# Patient Record
Sex: Female | Born: 1950 | Race: Black or African American | Hispanic: No | Marital: Married | Smoking: Never smoker
Health system: Southern US, Community
[De-identification: ages and names within clinical notes are randomized; demographics above are authoritative.]

## PROBLEM LIST (undated history)

## (undated) DIAGNOSIS — D649 Anemia, unspecified: Secondary | ICD-10-CM

## (undated) DIAGNOSIS — K5790 Diverticulosis of intestine, part unspecified, without perforation or abscess without bleeding: Secondary | ICD-10-CM

## (undated) DIAGNOSIS — I272 Pulmonary hypertension, unspecified: Secondary | ICD-10-CM

## (undated) DIAGNOSIS — Z9842 Cataract extraction status, left eye: Secondary | ICD-10-CM

## (undated) DIAGNOSIS — D563 Thalassemia minor: Secondary | ICD-10-CM

## (undated) DIAGNOSIS — I6789 Other cerebrovascular disease: Secondary | ICD-10-CM

## (undated) DIAGNOSIS — Z972 Presence of dental prosthetic device (complete) (partial): Secondary | ICD-10-CM

## (undated) DIAGNOSIS — K56609 Unspecified intestinal obstruction, unspecified as to partial versus complete obstruction: Secondary | ICD-10-CM

## (undated) DIAGNOSIS — K2950 Unspecified chronic gastritis without bleeding: Secondary | ICD-10-CM

## (undated) DIAGNOSIS — I639 Cerebral infarction, unspecified: Secondary | ICD-10-CM

## (undated) DIAGNOSIS — E66813 Obesity, class 3: Secondary | ICD-10-CM

## (undated) DIAGNOSIS — M170 Bilateral primary osteoarthritis of knee: Secondary | ICD-10-CM

## (undated) DIAGNOSIS — Z9889 Other specified postprocedural states: Secondary | ICD-10-CM

## (undated) DIAGNOSIS — T8859XA Other complications of anesthesia, initial encounter: Secondary | ICD-10-CM

## (undated) DIAGNOSIS — I619 Nontraumatic intracerebral hemorrhage, unspecified: Secondary | ICD-10-CM

## (undated) DIAGNOSIS — I700 Atherosclerosis of aorta: Secondary | ICD-10-CM

## (undated) DIAGNOSIS — E785 Hyperlipidemia, unspecified: Secondary | ICD-10-CM

## (undated) DIAGNOSIS — M51369 Other intervertebral disc degeneration, lumbar region without mention of lumbar back pain or lower extremity pain: Secondary | ICD-10-CM

## (undated) DIAGNOSIS — E119 Type 2 diabetes mellitus without complications: Secondary | ICD-10-CM

## (undated) DIAGNOSIS — J4540 Moderate persistent asthma, uncomplicated: Secondary | ICD-10-CM

## (undated) DIAGNOSIS — Z974 Presence of external hearing-aid: Secondary | ICD-10-CM

## (undated) DIAGNOSIS — K219 Gastro-esophageal reflux disease without esophagitis: Secondary | ICD-10-CM

## (undated) DIAGNOSIS — I10 Essential (primary) hypertension: Secondary | ICD-10-CM

## (undated) DIAGNOSIS — J189 Pneumonia, unspecified organism: Secondary | ICD-10-CM

## (undated) DIAGNOSIS — K8020 Calculus of gallbladder without cholecystitis without obstruction: Secondary | ICD-10-CM

## (undated) DIAGNOSIS — M1990 Unspecified osteoarthritis, unspecified site: Secondary | ICD-10-CM

## (undated) DIAGNOSIS — H9191 Unspecified hearing loss, right ear: Secondary | ICD-10-CM

## (undated) DIAGNOSIS — I2510 Atherosclerotic heart disease of native coronary artery without angina pectoris: Secondary | ICD-10-CM

## (undated) DIAGNOSIS — R112 Nausea with vomiting, unspecified: Secondary | ICD-10-CM

## (undated) HISTORY — PX: JOINT REPLACEMENT: SHX530

## (undated) HISTORY — DX: Morbid (severe) obesity due to excess calories: E66.01

## (undated) HISTORY — DX: Type 2 diabetes mellitus without complications: E11.9

## (undated) HISTORY — PX: OTHER SURGICAL HISTORY: SHX169

## (undated) HISTORY — DX: Calculus of gallbladder without cholecystitis without obstruction: K80.20

## (undated) HISTORY — DX: Anemia, unspecified: D64.9

## (undated) HISTORY — DX: Obesity, class 3: E66.813

## (undated) HISTORY — PX: ABDOMINAL HYSTERECTOMY: SHX81

## (undated) HISTORY — DX: Essential (primary) hypertension: I10

## (undated) HISTORY — PX: PARTIAL HYSTERECTOMY: SHX80

---

## 1977-03-06 HISTORY — PX: CHOLECYSTECTOMY: SHX55

## 1991-03-07 HISTORY — PX: PARTIAL HYSTERECTOMY: SHX80

## 2007-04-16 DIAGNOSIS — N951 Menopausal and female climacteric states: Secondary | ICD-10-CM | POA: Insufficient documentation

## 2007-05-29 ENCOUNTER — Ambulatory Visit: Payer: Self-pay | Admitting: Gastroenterology

## 2007-07-09 ENCOUNTER — Ambulatory Visit: Payer: Self-pay

## 2008-05-14 ENCOUNTER — Emergency Department: Payer: Self-pay | Admitting: Emergency Medicine

## 2008-08-14 ENCOUNTER — Ambulatory Visit: Payer: Self-pay | Admitting: Family Medicine

## 2008-10-20 ENCOUNTER — Ambulatory Visit: Payer: Self-pay

## 2009-11-02 ENCOUNTER — Ambulatory Visit: Payer: Self-pay | Admitting: Family Medicine

## 2012-03-06 DIAGNOSIS — K9189 Other postprocedural complications and disorders of digestive system: Secondary | ICD-10-CM

## 2012-03-06 HISTORY — DX: Other postprocedural complications and disorders of digestive system: K91.89

## 2012-06-04 DIAGNOSIS — G8929 Other chronic pain: Secondary | ICD-10-CM | POA: Insufficient documentation

## 2012-09-02 HISTORY — PX: TOTAL KNEE ARTHROPLASTY: SHX125

## 2012-09-06 LAB — CBC WITH DIFFERENTIAL/PLATELET
Basophil: 2 %
Eosinophil: 5 %
Lymphocytes: 26 %
MCHC: 30.8 g/dL — ABNORMAL LOW (ref 32.0–36.0)
Monocytes: 7 %
Platelet: 263 10*3/uL (ref 150–440)
RDW: 18.6 % — ABNORMAL HIGH (ref 11.5–14.5)

## 2012-09-06 LAB — PROTIME-INR: Prothrombin Time: 15.5 secs — ABNORMAL HIGH (ref 11.5–14.7)

## 2012-09-06 LAB — APTT: Activated PTT: 40.1 secs — ABNORMAL HIGH (ref 23.6–35.9)

## 2012-09-07 ENCOUNTER — Inpatient Hospital Stay: Payer: Self-pay | Admitting: Specialist

## 2012-09-07 LAB — COMPREHENSIVE METABOLIC PANEL
Alkaline Phosphatase: 86 U/L (ref 50–136)
Anion Gap: 7 (ref 7–16)
BUN: 23 mg/dL — ABNORMAL HIGH (ref 7–18)
Calcium, Total: 9 mg/dL (ref 8.5–10.1)
Chloride: 100 mmol/L (ref 98–107)
EGFR (African American): 35 — ABNORMAL LOW
EGFR (Non-African Amer.): 30 — ABNORMAL LOW
Glucose: 136 mg/dL — ABNORMAL HIGH (ref 65–99)
SGPT (ALT): 27 U/L (ref 12–78)
Total Protein: 7.1 g/dL (ref 6.4–8.2)

## 2012-09-07 LAB — CK TOTAL AND CKMB (NOT AT ARMC)
CK, Total: 170 U/L (ref 21–215)
CK, Total: 172 U/L (ref 21–215)
CK, Total: 245 U/L — ABNORMAL HIGH (ref 21–215)
CK-MB: 1.2 ng/mL (ref 0.5–3.6)
CK-MB: 1.2 ng/mL (ref 0.5–3.6)
CK-MB: 1.3 ng/mL (ref 0.5–3.6)

## 2012-09-07 LAB — BASIC METABOLIC PANEL
Anion Gap: 9 (ref 7–16)
Calcium, Total: 8.2 mg/dL — ABNORMAL LOW (ref 8.5–10.1)
Co2: 23 mmol/L (ref 21–32)
Glucose: 110 mg/dL — ABNORMAL HIGH (ref 65–99)

## 2012-09-07 LAB — TROPONIN I: Troponin-I: 0.46 ng/mL — ABNORMAL HIGH

## 2012-09-08 LAB — BASIC METABOLIC PANEL
BUN: 15 mg/dL (ref 7–18)
Calcium, Total: 7.9 mg/dL — ABNORMAL LOW (ref 8.5–10.1)
Chloride: 109 mmol/L — ABNORMAL HIGH (ref 98–107)
EGFR (African American): >60
Potassium: 4.1 mmol/L (ref 3.5–5.1)

## 2012-09-08 LAB — CBC WITH DIFFERENTIAL/PLATELET
Eosinophil #: 0.6 10*3/uL (ref 0.0–0.7)
HGB: 7.1 g/dL — ABNORMAL LOW (ref 12.0–16.0)
Lymphocyte #: 1.7 10*3/uL (ref 1.0–3.6)
Lymphocyte %: 16.5 %
MCH: 18.5 pg — ABNORMAL LOW (ref 26.0–34.0)
MCHC: 32.3 g/dL (ref 32.0–36.0)
Monocyte #: 1.3 x10 3/mm — ABNORMAL HIGH (ref 0.2–0.9)
Monocyte %: 13.2 %
Neutrophil #: 6.5 10*3/uL (ref 1.4–6.5)
Neutrophil %: 64.6 %
Platelet: 236 10*3/uL (ref 150–440)
RBC: 3.85 10*6/uL (ref 3.80–5.20)
RDW: 18.3 % — ABNORMAL HIGH (ref 11.5–14.5)

## 2012-09-08 LAB — IRON AND TIBC
Iron Saturation: 14 %
Unbound Iron-Bind.Cap.: 130 ug/dL

## 2012-09-08 LAB — RETICULOCYTES: Reticulocyte: 1.52 %

## 2012-09-09 LAB — CBC WITH DIFFERENTIAL/PLATELET
Basophil #: 0 10*3/uL (ref 0.0–0.1)
Eosinophil #: 0.4 10*3/uL (ref 0.0–0.7)
Eosinophil %: 2.8 %
MCHC: 32.3 g/dL (ref 32.0–36.0)
MCV: 60 fL — ABNORMAL LOW (ref 80–100)
Monocyte #: 1.4 x10 3/mm — ABNORMAL HIGH (ref 0.2–0.9)
Monocyte %: 10.8 %
Neutrophil %: 74.2 %
Platelet: 314 10*3/uL (ref 150–440)
RBC: 4.89 10*6/uL (ref 3.80–5.20)
RDW: 19.6 % — ABNORMAL HIGH (ref 11.5–14.5)
WBC: 13.4 10*3/uL — ABNORMAL HIGH (ref 3.6–11.0)

## 2012-09-10 LAB — CBC WITH DIFFERENTIAL/PLATELET
Eosinophil #: 0.3 10*3/uL (ref 0.0–0.7)
HCT: 29.7 % — ABNORMAL LOW (ref 35.0–47.0)
HGB: 9.5 g/dL — ABNORMAL LOW (ref 12.0–16.0)
Lymphocyte #: 2 10*3/uL (ref 1.0–3.6)
MCHC: 32.1 g/dL (ref 32.0–36.0)
Neutrophil #: 11.2 10*3/uL — ABNORMAL HIGH (ref 1.4–6.5)
RDW: 19.3 % — ABNORMAL HIGH (ref 11.5–14.5)

## 2012-09-10 LAB — BASIC METABOLIC PANEL
Anion Gap: 8 (ref 7–16)
BUN: 5 mg/dL — ABNORMAL LOW (ref 7–18)
Calcium, Total: 9.2 mg/dL (ref 8.5–10.1)
Chloride: 111 mmol/L — ABNORMAL HIGH (ref 98–107)
Co2: 24 mmol/L (ref 21–32)
Creatinine: 0.76 mg/dL (ref 0.60–1.30)
EGFR (African American): >60
EGFR (Non-African Amer.): >60
Glucose: 79 mg/dL (ref 65–99)
Osmolality: 281 (ref 275–301)

## 2012-09-10 LAB — PHOSPHORUS: Phosphorus: 5.8 mg/dL — ABNORMAL HIGH (ref 2.5–4.9)

## 2012-09-10 LAB — MAGNESIUM: Magnesium: 2.7 mg/dL — ABNORMAL HIGH

## 2012-09-11 LAB — CBC WITH DIFFERENTIAL/PLATELET
Eosinophil %: 4.4 %
HCT: 30 % — ABNORMAL LOW (ref 35.0–47.0)
HGB: 9.7 g/dL — ABNORMAL LOW (ref 12.0–16.0)
MCH: 19.3 pg — ABNORMAL LOW (ref 26.0–34.0)
MCHC: 32.2 g/dL (ref 32.0–36.0)
MCV: 60 fL — ABNORMAL LOW (ref 80–100)
Monocyte %: 4.9 %
Neutrophil #: 14.1 10*3/uL — ABNORMAL HIGH (ref 1.4–6.5)
RBC: 5.01 10*6/uL (ref 3.80–5.20)
WBC: 18 10*3/uL — ABNORMAL HIGH (ref 3.6–11.0)

## 2012-09-11 LAB — COMPREHENSIVE METABOLIC PANEL
Albumin: 2.5 g/dL — ABNORMAL LOW (ref 3.4–5.0)
Alkaline Phosphatase: 110 U/L (ref 50–136)
Calcium, Total: 9.2 mg/dL (ref 8.5–10.1)
Chloride: 102 mmol/L (ref 98–107)
Co2: 30 mmol/L (ref 21–32)
EGFR (Non-African Amer.): >60
Osmolality: 281 (ref 275–301)
SGOT(AST): 39 U/L — ABNORMAL HIGH (ref 15–37)
SGPT (ALT): 48 U/L (ref 12–78)
Sodium: 140 mmol/L (ref 136–145)
Total Protein: 7.5 g/dL (ref 6.4–8.2)

## 2012-09-11 LAB — PROTIME-INR
INR: 1.2
Prothrombin Time: 15.1 secs — ABNORMAL HIGH (ref 11.5–14.7)

## 2012-09-11 LAB — APTT: Activated PTT: 32.5 secs (ref 23.6–35.9)

## 2012-09-12 LAB — CBC WITH DIFFERENTIAL/PLATELET
Basophil %: 0.4 %
Eosinophil #: 0.6 10*3/uL (ref 0.0–0.7)
Eosinophil %: 3.7 %
HCT: 27.7 % — ABNORMAL LOW (ref 35.0–47.0)
HGB: 8.9 g/dL — ABNORMAL LOW (ref 12.0–16.0)
Lymphocyte #: 2.2 10*3/uL (ref 1.0–3.6)
Lymphocyte %: 14.9 %
Monocyte #: 1 x10 3/mm — ABNORMAL HIGH (ref 0.2–0.9)
Monocyte %: 6.7 %
Neutrophil %: 74.3 %
RBC: 4.6 10*6/uL (ref 3.80–5.20)
RDW: 18.2 % — ABNORMAL HIGH (ref 11.5–14.5)

## 2012-09-12 LAB — BASIC METABOLIC PANEL
BUN: 15 mg/dL (ref 7–18)
Co2: 29 mmol/L (ref 21–32)
Creatinine: 0.94 mg/dL (ref 0.60–1.30)
EGFR (African American): >60
EGFR (Non-African Amer.): >60
Osmolality: 283 (ref 275–301)
Sodium: 142 mmol/L (ref 136–145)

## 2012-09-12 LAB — URINALYSIS, COMPLETE
Bilirubin,UR: NEGATIVE
Blood: NEGATIVE
Glucose,UR: NEGATIVE mg/dL (ref 0–75)
Protein: 30
Squamous Epithelial: 72

## 2012-09-13 LAB — BASIC METABOLIC PANEL
Anion Gap: 8 (ref 7–16)
BUN: 10 mg/dL (ref 7–18)
Calcium, Total: 8.3 mg/dL — ABNORMAL LOW (ref 8.5–10.1)
Chloride: 107 mmol/L (ref 98–107)
Co2: 26 mmol/L (ref 21–32)
EGFR (African American): >60
EGFR (Non-African Amer.): >60
Glucose: 86 mg/dL (ref 65–99)
Potassium: 3.6 mmol/L (ref 3.5–5.1)

## 2012-09-13 LAB — CBC WITH DIFFERENTIAL/PLATELET
HCT: 27.3 % — ABNORMAL LOW (ref 35.0–47.0)
Lymphocytes: 16 %
Monocytes: 3 %
Myelocyte: 1 %
Platelet: 448 10*3/uL — ABNORMAL HIGH (ref 150–440)
RBC: 4.49 10*6/uL (ref 3.80–5.20)
RDW: 18.3 % — ABNORMAL HIGH (ref 11.5–14.5)
Segmented Neutrophils: 74 %

## 2012-09-18 DIAGNOSIS — R918 Other nonspecific abnormal finding of lung field: Secondary | ICD-10-CM | POA: Insufficient documentation

## 2012-09-18 DIAGNOSIS — Z96651 Presence of right artificial knee joint: Secondary | ICD-10-CM | POA: Insufficient documentation

## 2012-09-19 ENCOUNTER — Encounter: Payer: Self-pay | Admitting: *Deleted

## 2012-10-07 ENCOUNTER — Encounter: Payer: Self-pay | Admitting: *Deleted

## 2012-10-08 ENCOUNTER — Encounter: Payer: Self-pay | Admitting: Cardiovascular Disease

## 2012-10-08 ENCOUNTER — Ambulatory Visit (INDEPENDENT_AMBULATORY_CARE_PROVIDER_SITE_OTHER): Payer: BC Managed Care – PPO | Admitting: Cardiovascular Disease

## 2012-10-08 VITALS — BP 102/68 | HR 82 | Ht 60.0 in | Wt 209.8 lb

## 2012-10-08 DIAGNOSIS — I2789 Other specified pulmonary heart diseases: Secondary | ICD-10-CM

## 2012-10-08 DIAGNOSIS — I272 Pulmonary hypertension, unspecified: Secondary | ICD-10-CM

## 2012-10-08 DIAGNOSIS — I10 Essential (primary) hypertension: Secondary | ICD-10-CM | POA: Insufficient documentation

## 2012-10-08 DIAGNOSIS — R0600 Dyspnea, unspecified: Secondary | ICD-10-CM | POA: Insufficient documentation

## 2012-10-08 DIAGNOSIS — R0609 Other forms of dyspnea: Secondary | ICD-10-CM

## 2012-10-08 NOTE — Assessment & Plan Note (Signed)
Current symptoms include exertional dyspnea without chest pain. She had a recent elevation in cardiac enzymes in the setting of small bowel obstruction with associated acute renal failure and tachycardia. This was likely due to supply demand ischemia. However, she has multiple risk factors for coronary artery disease and thus I recommend evaluation with a pharmacologic nuclear stress test. She is not able to exercise on a treadmill due to her recent right knee replacement.

## 2012-10-08 NOTE — Assessment & Plan Note (Signed)
She has been feeling dizzy with slightly low blood pressure. Thus, I will hold amlodipine for now and reevaluate upon followup.

## 2012-10-08 NOTE — Assessment & Plan Note (Signed)
Echocardiogram showed significant pulmonary hypertension of unclear etiology. I suspect that this might be due to chronic diastolic heart failure and possible underlying sleep apnea.  I will consider further evaluation for this after ischemic cardiac evaluation.

## 2012-10-08 NOTE — Progress Notes (Signed)
Primary care physician: Dr. Ulanda Edison  HPI  This is a pleasant 62 year old African American female who was referred by Dr. Mayford Knife for cardiac evaluation. She has known history of hypertension and diabetes mellitus. She underwent recent right knee replacement at [facility]. She presented shortly after that to [facility] with nausea, vomiting and abdominal pain. She was found to have small bowel obstruction with acute renal failure. She was hypoxic and tachycardic. Cardiac enzymes were mildly elevated which was felt to be due to supply demand ischemia. She underwent lower extremity venous Doppler and VQ scan. Both of them were negative. She had an echocardiogram done which showed normal LV systolic function with possible septal hypokinesis. There was mild tricuspid regurgitation with moderate to severe pulmonary hypertension with an estimated pulmonary artery systolic pressure of 65 mmHg. She has been feeling better since hospital discharge. She denies any chest discomfort. She still complains of dizziness. Blood pressure has been running slightly low. She denies any orthopnea or PND.  Allergies  Allergen Reactions  . Penicillins      Current Outpatient Prescriptions on File Prior to Visit  Medication Sig Dispense Refill  . acetaminophen (TYLENOL) 500 MG tablet Take 500 mg by mouth every 6 (six) hours as needed for pain.      . cetirizine (ZYRTEC) 10 MG tablet Take 10 mg by mouth daily.      Marland Kitchen docusate sodium (COLACE) 100 MG capsule Take 100 mg by mouth every other day.      . promethazine (PHENERGAN) 12.5 MG tablet Take 12.5 mg by mouth every 6 (six) hours as needed for nausea.       No current facility-administered medications on file prior to visit.     Past Medical History  Diagnosis Date  . HTN (hypertension)   . Obesity, Class III, 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y)   . DM (diabetes mellitus)   . Anemia   . Gallstones      Past Surgical History  Procedure Laterality Date  . Partial  hysterectomy    . Gallstones       Family History  Problem Relation Age of Onset  . Heart attack Father      History   Social History  . Marital Status: Married    Spouse Name: N/A    Number of Children: N/A  . Years of Education: N/A   Occupational History  . Not on file.   Social History Main Topics  . Smoking status: Never Smoker   . Smokeless tobacco: Not on file  . Alcohol Use: No  . Drug Use: No  . Sexually Active: Not on file   Other Topics Concern  . Not on file   Social History Narrative  . No narrative on file     ROS Constitutional: Negative for fever, chills, diaphoresis, activity change, appetite change and fatigue.  HENT: Negative for hearing loss, nosebleeds, congestion, sore throat, facial swelling, drooling, trouble swallowing, neck pain, voice change, sinus pressure and tinnitus.  Eyes: Negative for photophobia, pain, discharge and visual disturbance.  Respiratory: Negative for apnea, cough and wheezing.  Cardiovascular: Negative for chest pain, palpitations.  Gastrointestinal: Negative for nausea, vomiting, abdominal pain, diarrhea, constipation, blood in stool and abdominal distention.  Genitourinary: Negative for dysuria, urgency, frequency, hematuria and decreased urine volume.  Musculoskeletal: Negative for myalgias, back pain, joint swelling, arthralgias and gait problem.  Skin: Negative for color change, pallor, rash and wound.  Neurological: Negative for dizziness, tremors, seizures, syncope, speech difficulty, weakness, light-headedness,  numbness and headaches.  Psychiatric/Behavioral: Negative for suicidal ideas, hallucinations, behavioral problems and agitation. The patient is not nervous/anxious.     PHYSICAL EXAM   BP 102/68  Pulse 82  Ht 5' (1.524 m)  Wt 209 lb 12 oz (95.142 kg)  BMI 40.96 kg/m2 Constitutional: She is oriented to person, place, and time. She appears well-developed and well-nourished. No distress.  HENT: No  nasal discharge.  Head: Normocephalic and atraumatic.  Eyes: Pupils are equal and round. Right eye exhibits no discharge. Left eye exhibits no discharge.  Neck: Normal range of motion. Neck supple. No JVD present. No thyromegaly present.  Cardiovascular: Normal rate, regular rhythm, normal heart sounds. Exam reveals no gallop and no friction rub. No murmur heard.  Pulmonary/Chest: Effort normal and breath sounds normal. No stridor. No respiratory distress. She has no wheezes. She has no rales. She exhibits no tenderness.  Abdominal: Soft. Bowel sounds are normal. She exhibits no distension. There is no tenderness. There is no rebound and no guarding.  Musculoskeletal: Normal range of motion. She exhibits no edema and no tenderness.  Neurological: She is alert and oriented to person, place, and time. Coordination normal.  Skin: Skin is warm and dry. No rash noted. She is not diaphoretic. No erythema. No pallor.  Psychiatric: She has a normal mood and affect. Her behavior is normal. Judgment and thought content normal.     ZOX:WRUEA  Rhythm  - occasional ectopic ventricular beat    -Left axis.   ABNORMAL     ASSESSMENT AND PLAN

## 2012-10-08 NOTE — Patient Instructions (Addendum)
Your physician has requested that you have a lexiscan myoview. For further information please visit [email or web address] Please follow instruction sheet, as given.  Stop taking Amlodipine.   Follow up in 1 month.   [facility] MYOVIEW  Your caregiver has ordered a Stress Test with nuclear imaging. The purpose of this test is to evaluate the blood supply to your heart muscle. This procedure is referred to as a "Non-Invasive Stress Test." This is because other than having an IV started in your vein, nothing is inserted or "invades" your body. Cardiac stress tests are done to find areas of poor blood flow to the heart by determining the extent of coronary artery disease (CAD). Some patients exercise on a treadmill, which naturally increases the blood flow to your heart, while others who are  unable to walk on a treadmill due to physical limitations have a pharmacologic/chemical stress agent called Lexiscan . This medicine will mimic walking on a treadmill by temporarily increasing your coronary blood flow.   Please note: these test may take anywhere between 2-4 hours to complete  PLEASE REPORT TO [facility] ENTR[facility]  THE VOLUNTEERS AT THE FIRST DESK WILL DIRECT YOU WHERE TO GO  Date of Procedure:___________08/22/2014__________________________  Arrival Time for Procedure:______7:30am________________________  Instructions regarding medication:   __X__ : Hold diabetes medication morning of procedure   PLEASE NOTIFY THE OFFICE AT LEAST 24 HOURS IN ADVANCE IF YOU ARE UNABLE TO KEEP YOUR APPOINTMENT.  [phone] AND  PLEASE NOTIFY NUCLEAR MEDICINE AT [facility] AT LEAST 24 HOURS IN ADVANCE IF YOU ARE UNABLE TO KEEP YOUR APPOINTMENT. [phone]  How to prepare for your Myoview test:  1. Do not eat or drink after midnight 2. No caffeine for 24 hours prior to test 3. No smoking 24 hours prior to test. 4. Your medication may be taken with water.  If your doctor stopped a medication because of this  test, do not take that medication. 5. Ladies, please do not wear dresses.  Skirts or pants are appropriate. Please wear a short sleeve shirt. 6. No perfume, cologne or lotion. 7. Wear comfortable walking shoes. No heels!

## 2012-10-18 DIAGNOSIS — N259 Disorder resulting from impaired renal tubular function, unspecified: Secondary | ICD-10-CM | POA: Insufficient documentation

## 2012-10-25 ENCOUNTER — Ambulatory Visit: Payer: Self-pay | Admitting: Cardiovascular Disease

## 2012-10-25 DIAGNOSIS — R0602 Shortness of breath: Secondary | ICD-10-CM

## 2012-10-28 ENCOUNTER — Other Ambulatory Visit: Payer: Self-pay

## 2012-10-28 DIAGNOSIS — R0600 Dyspnea, unspecified: Secondary | ICD-10-CM

## 2012-10-29 ENCOUNTER — Telehealth: Payer: Self-pay

## 2012-10-29 NOTE — Telephone Encounter (Signed)
Message copied by Marilynne Halsted on Tue Oct 29, 2012  4:56 PM ------      Message from: Lorine Bears A      Created: Mon Oct 28, 2012  5:50 PM       Inform patient that  stress test was normal. ------

## 2012-11-01 ENCOUNTER — Telehealth: Payer: Self-pay

## 2012-11-01 NOTE — Telephone Encounter (Signed)
Message copied by Marilynne Halsted on Fri Nov 01, 2012 10:19 AM ------      Message from: Lorine Bears A      Created: Mon Oct 28, 2012  5:50 PM       Inform patient that  stress test was normal. ------

## 2012-11-01 NOTE — Telephone Encounter (Signed)
Pt aware of results 

## 2012-11-01 NOTE — Telephone Encounter (Signed)
Message copied by Marilynne Halsted on Fri Nov 01, 2012 10:18 AM ------      Message from: Lorine Bears A      Created: Mon Oct 28, 2012  5:50 PM       Inform patient that  stress test was normal. ------

## 2012-11-08 ENCOUNTER — Encounter: Payer: Self-pay | Admitting: Cardiovascular Disease

## 2012-11-08 ENCOUNTER — Ambulatory Visit (INDEPENDENT_AMBULATORY_CARE_PROVIDER_SITE_OTHER): Payer: BC Managed Care – PPO | Admitting: Cardiovascular Disease

## 2012-11-08 VITALS — BP 126/76 | HR 74 | Ht 60.0 in | Wt 202.5 lb

## 2012-11-08 DIAGNOSIS — R0600 Dyspnea, unspecified: Secondary | ICD-10-CM

## 2012-11-08 DIAGNOSIS — I2789 Other specified pulmonary heart diseases: Secondary | ICD-10-CM

## 2012-11-08 DIAGNOSIS — I10 Essential (primary) hypertension: Secondary | ICD-10-CM

## 2012-11-08 DIAGNOSIS — R0609 Other forms of dyspnea: Secondary | ICD-10-CM

## 2012-11-08 DIAGNOSIS — I272 Pulmonary hypertension, unspecified: Secondary | ICD-10-CM

## 2012-11-08 NOTE — Progress Notes (Signed)
Primary care physician: Dr. Ulanda Edison  HPI  This is a pleasant 62 year old African American female who is here for a follow up visit. . She has known history of hypertension and diabetes mellitus. She underwent recent right knee replacement at [facility]. She presented shortly after that to [facility] with nausea, vomiting and abdominal pain. She was found to have small bowel obstruction with acute renal failure. She was hypoxic and tachycardic. Cardiac enzymes were mildly elevated which was felt to be due to supply demand ischemia. She underwent lower extremity venous Doppler and VQ scan. Both of them were negative. She had an echocardiogram done which showed normal LV systolic function with possible septal hypokinesis. There was mild tricuspid regurgitation with moderate to severe pulmonary hypertension with an estimated pulmonary artery systolic pressure of 65 mmHg. She has been feeling better since hospital discharge. She denies any chest discomfort.  I referred her for a pharmacologic nuclear stress test which showed no evidence of ischemia with normal wall motion and ejection fraction. She is feeling well and denies any shortness of breath.   Allergies  Allergen Reactions  . Penicillins      Current Outpatient Prescriptions on File Prior to Visit  Medication Sig Dispense Refill  . acetaminophen (TYLENOL) 500 MG tablet Take 500 mg by mouth every 6 (six) hours as needed for pain.      . cetirizine (ZYRTEC) 10 MG tablet Take 10 mg by mouth daily.      . citalopram (CELEXA) 20 MG tablet Take 20 mg by mouth daily.      Marland Kitchen docusate sodium (COLACE) 100 MG capsule Take 100 mg by mouth every other day.      . estradiol (ESTRACE) 0.5 MG tablet Take 0.5 mg by mouth daily.      . ferrous gluconate (FERGON) 325 MG tablet Take 325 mg by mouth daily with breakfast.      . lisinopril-hydrochlorothiazide (PRINZIDE,ZESTORETIC) 20-12.5 MG per tablet Take 1 tablet by mouth 2 (two) times daily.      . metFORMIN  (GLUCOPHAGE) 500 MG tablet Take 500 mg by mouth daily with breakfast.       . promethazine (PHENERGAN) 12.5 MG tablet Take 12.5 mg by mouth every 6 (six) hours as needed for nausea.      . ranitidine (ZANTAC) 150 MG tablet Take 150 mg by mouth 2 (two) times daily.       No current facility-administered medications on file prior to visit.     Past Medical History  Diagnosis Date  . HTN (hypertension)   . Obesity, Class III, 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y)   . DM (diabetes mellitus)   . Anemia   . Gallstones      Past Surgical History  Procedure Laterality Date  . Partial hysterectomy    . Gallstones       Family History  Problem Relation Age of Onset  . Heart attack Father      History   Social History  . Marital Status: Married    Spouse Name: N/A    Number of Children: N/A  . Years of Education: N/A   Occupational History  . Not on file.   Social History Main Topics  . Smoking status: Never Smoker   . Smokeless tobacco: Not on file  . Alcohol Use: No  . Drug Use: No  . Sexual Activity: Not on file   Other Topics Concern  . Not on file   Social History Narrative  . No  narrative on file        PHYSICAL EXAM   BP 126/76  Pulse 74  Ht 5' (1.524 m)  Wt 202 lb 8 oz (91.853 kg)  BMI 39.55 kg/m2 Constitutional: She is oriented to person, place, and time. She appears well-developed and well-nourished. No distress.  HENT: No nasal discharge.  Head: Normocephalic and atraumatic.  Eyes: Pupils are equal and round. Right eye exhibits no discharge. Left eye exhibits no discharge.  Neck: Normal range of motion. Neck supple. No JVD present. No thyromegaly present.  Cardiovascular: Normal rate, regular rhythm, normal heart sounds. Exam reveals no gallop and no friction rub. No murmur heard.  Pulmonary/Chest: Effort normal and breath sounds normal. No stridor. No respiratory distress. She has no wheezes. She has no rales. She exhibits no tenderness.  Abdominal:  Soft. Bowel sounds are normal. She exhibits no distension. There is no tenderness. There is no rebound and no guarding.  Musculoskeletal: Normal range of motion. She exhibits no edema and no tenderness.  Neurological: She is alert and oriented to person, place, and time. Coordination normal.  Skin: Skin is warm and dry. No rash noted. She is not diaphoretic. No erythema. No pallor.  Psychiatric: She has a normal mood and affect. Her behavior is normal. Judgment and thought content normal.     ABNORMAL     ASSESSMENT AND PLAN

## 2012-11-08 NOTE — Patient Instructions (Addendum)
Schedule an echo in 3 months.  Follow up 1 week after echo.

## 2012-11-09 NOTE — Assessment & Plan Note (Signed)
This overall  Resolved. Recent nuclear stress test showed no evidence of ischemia with normal ejection fraction.

## 2012-11-09 NOTE — Assessment & Plan Note (Signed)
Blood pressure is well controlled on current medications. 

## 2012-11-09 NOTE — Assessment & Plan Note (Signed)
Echocardiogram while hospitalized showed moderate to severe pulmonary hypertension. There is no clinical evidence of diastolic heart failure. She has no symptoms suggestive of sleep apnea. Evaluation for pulmonary embolism was also negative while hospitalized.  The etiology of this is not entirely clear and this was noted only when she was under stress from small bowel obstruction. I will plan on repeating her echocardiogram in 3 months. If that continues to show significant pulmonary hypertension, she will need further evaluation. Currently,  she seems to be stable.

## 2013-02-11 ENCOUNTER — Other Ambulatory Visit: Payer: Self-pay

## 2013-02-11 ENCOUNTER — Other Ambulatory Visit (HOSPITAL_BASED_OUTPATIENT_CLINIC_OR_DEPARTMENT_OTHER): Payer: BC Managed Care – PPO

## 2013-02-11 DIAGNOSIS — R0600 Dyspnea, unspecified: Secondary | ICD-10-CM

## 2013-02-11 DIAGNOSIS — R0609 Other forms of dyspnea: Secondary | ICD-10-CM

## 2013-02-17 ENCOUNTER — Telehealth: Payer: Self-pay | Admitting: *Deleted

## 2013-02-17 NOTE — Telephone Encounter (Signed)
Patient said her job would not allow her to be off this week and she needed to change appt to 12/22. Appt changed to 11:15am on 02/24/2013. Patient aware.

## 2013-02-20 ENCOUNTER — Ambulatory Visit: Payer: BC Managed Care – PPO | Admitting: Cardiovascular Disease

## 2013-02-24 ENCOUNTER — Ambulatory Visit (INDEPENDENT_AMBULATORY_CARE_PROVIDER_SITE_OTHER): Payer: BC Managed Care – PPO | Admitting: Cardiovascular Disease

## 2013-02-24 ENCOUNTER — Encounter: Payer: Self-pay | Admitting: Cardiovascular Disease

## 2013-02-24 VITALS — BP 107/74 | HR 63 | Ht 60.0 in | Wt 210.8 lb

## 2013-02-24 DIAGNOSIS — I10 Essential (primary) hypertension: Secondary | ICD-10-CM

## 2013-02-24 DIAGNOSIS — R0609 Other forms of dyspnea: Secondary | ICD-10-CM

## 2013-02-24 DIAGNOSIS — R0600 Dyspnea, unspecified: Secondary | ICD-10-CM

## 2013-02-24 DIAGNOSIS — I272 Pulmonary hypertension, unspecified: Secondary | ICD-10-CM

## 2013-02-24 DIAGNOSIS — I2789 Other specified pulmonary heart diseases: Secondary | ICD-10-CM

## 2013-02-24 NOTE — Progress Notes (Signed)
Primary care physician: Dr. Ulanda Edison  HPI  This is a pleasant 62 year old African American female who is here for a follow up visit. . She has known history of hypertension and diabetes mellitus. She underwent right knee replacement in July at [facility]. She presented shortly after that to [facility] with nausea, vomiting and abdominal pain. She was found to have small bowel obstruction with acute renal failure. She was hypoxic and tachycardic. Cardiac enzymes were mildly elevated which was felt to be due to supply demand ischemia. She underwent lower extremity venous Doppler and VQ scan. Both of them were negative. She had an echocardiogram done which showed normal LV systolic function with possible septal hypokinesis. There was mild tricuspid regurgitation with moderate to severe pulmonary hypertension with an estimated pulmonary artery systolic pressure of 65 mmHg. She underwent a pharmacologic nuclear stress test after discharge which showed no evidence of ischemia with normal wall motion and ejection fraction. A follow up echo was done recently which showed normal EF and normal pulmonary pressure. She has no complaints today.    Allergies  Allergen Reactions  . Penicillins      Current Outpatient Prescriptions on File Prior to Visit  Medication Sig Dispense Refill  . aspirin 325 MG tablet Take 325 mg by mouth daily.      . cetirizine (ZYRTEC) 10 MG tablet Take 10 mg by mouth daily.      . citalopram (CELEXA) 20 MG tablet Take 20 mg by mouth daily.      Marland Kitchen docusate sodium (COLACE) 100 MG capsule Take 100 mg by mouth every other day.      . estradiol (ESTRACE) 0.5 MG tablet Take 0.5 mg by mouth daily.      . ferrous gluconate (FERGON) 325 MG tablet Take 325 mg by mouth daily with breakfast.      . lisinopril-hydrochlorothiazide (PRINZIDE,ZESTORETIC) 20-12.5 MG per tablet Take 1 tablet by mouth 2 (two) times daily.      . metFORMIN (GLUCOPHAGE) 500 MG tablet Take 500 mg by mouth daily with  breakfast.       . ranitidine (ZANTAC) 150 MG tablet Take 150 mg by mouth 2 (two) times daily.       No current facility-administered medications on file prior to visit.     Past Medical History  Diagnosis Date  . HTN (hypertension)   . Obesity, Class III, 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y)   . DM (diabetes mellitus)   . Anemia   . Gallstones      Past Surgical History  Procedure Laterality Date  . Partial hysterectomy    . Gallstones       Family History  Problem Relation Age of Onset  . Heart attack Father      History   Social History  . Marital Status: Married    Spouse Name: N/A    Number of Children: N/A  . Years of Education: N/A   Occupational History  . Not on file.   Social History Main Topics  . Smoking status: Never Smoker   . Smokeless tobacco: Not on file  . Alcohol Use: No  . Drug Use: No  . Sexual Activity: Not on file   Other Topics Concern  . Not on file   Social History Narrative  . No narrative on file        PHYSICAL EXAM   BP 107/74  Pulse 63  Ht 5' (1.524 m)  Wt 210 lb 12 oz (95.596 kg)  BMI 41.16 kg/m2 Constitutional: She is oriented to person, place, and time. She appears well-developed and well-nourished. No distress.  HENT: No nasal discharge.  Head: Normocephalic and atraumatic.  Eyes: Pupils are equal and round. Right eye exhibits no discharge. Left eye exhibits no discharge.  Neck: Normal range of motion. Neck supple. No JVD present. No thyromegaly present.  Cardiovascular: Normal rate, regular rhythm, normal heart sounds. Exam reveals no gallop and no friction rub. No murmur heard.  Pulmonary/Chest: Effort normal and breath sounds normal. No stridor. No respiratory distress. She has no wheezes. She has no rales. She exhibits no tenderness.  Abdominal: Soft. Bowel sounds are normal. She exhibits no distension. There is no tenderness. There is no rebound and no guarding.  Musculoskeletal: Normal range of motion. She  exhibits no edema and no tenderness.  Neurological: She is alert and oriented to person, place, and time. Coordination normal.  Skin: Skin is warm and dry. No rash noted. She is not diaphoretic. No erythema. No pallor.  Psychiatric: She has a normal mood and affect. Her behavior is normal. Judgment and thought content normal.     ASSESSMENT AND PLAN

## 2013-02-24 NOTE — Patient Instructions (Signed)
Your physician recommends that you continue on your current medications as directed. Please refer to the Current Medication list given to you today.  Follow up as needed  

## 2013-03-03 NOTE — Assessment & Plan Note (Signed)
Resolved. Negative ischemic cardiac evaluation. Echocardiogram showed resolution of pulmonary hypertension.  Follow up as needed.

## 2013-03-03 NOTE — Assessment & Plan Note (Signed)
Pulmonary pressure was normal on recent echo.

## 2013-03-03 NOTE — Assessment & Plan Note (Signed)
Controlled on current medications 

## 2013-03-25 ENCOUNTER — Ambulatory Visit: Payer: Self-pay | Admitting: Family Medicine

## 2014-06-26 NOTE — Consult Note (Signed)
Brief Consult Note: Diagnosis: Elevated troponin without chest pain.   Patient was seen by consultant.   Consult note dictated.   Comments: Patient has troponin 0.46, probably from demand ischaemia, but advise continuation of lovenox until VQ scan result is known. Also advise echo to assess wall motion.  Electronic Signatures: Angelica Ran (MD)  (Signed 05-Jul-14 09:40)  Authored: Brief Consult Note   Last Updated: 05-Jul-14 09:40 by Angelica Ran (MD)

## 2014-06-26 NOTE — H&P (Signed)
PATIENT NAME:  Whitney Banks, Whitney Banks MR#:  [id number] DATE OF BIRTH:  January 05, 1951  DATE OF ADMISSION:  09/07/2012  REFERRING PHYSICIAN:  Dr. Elyn Peers.  PRIMARY CARE PHYSICIAN:  Dr. Delight Stare.   CHIEF COMPLAINT:  Diarrhea, nausea and vomiting.   HISTORY OF PRESENT ILLNESS:  This is a 64 year old female who presents from home with above-mentioned complaints, the patient had recent history of right total knee replacement at [facility], was discharged home before five days, the patient reports over the last two days she has been feeling weak and had decreased by mouth intake, had been having nausea, vomiting and diarrhea and mild abdominal pain, in the ED, the patient had CT abdomen which did show evidence of small bowel obstruction where she was seen by surgery, Dr. Burt Knack who felt that this most likely is due to adhesions and currently recommends conservative management with NG tube under intermittent suctioning where she had so far around 500 mL of green fluid, but the patient was found to be mildly hypoxic as well tachycardic with elevated troponin, the patient reports she was not ambulatory over the last five days, so there was suspicion for PE, the patient had venous Doppler which was negative for DVT, the patient received one dose of subQ Lovenox empirically as her creatinine was elevated and she could not get CT scan chest with IV contrast to rule out PE, as well patient had elevated troponin, but she denies any chest pain, and we had no old EKG to compare with current EKG which shows poor progression of R waves in anterior leads, as the patient was found to have hemoglobin of 8.2, her records was obtained from [facility] where her last hemoglobin on July 1st was 9, she denies any coffee-ground emesis, as well denies any bright red blood per rectum or melena, the patient is in acute renal failure with a creatinine of 1.8, last value was 1 from [facility] records, the patient as well was given 324 of aspirin in the ED because  of her elevated troponin, given her diarrhea the patient had C. diff. sent in ED which came back negative, hospitalist service were requested to admit the patient for management of her medical problems.   PAST MEDICAL HISTORY: 1.  Diabetes.  2.  Hypertension.  3.  Anemia.   PAST SURGICAL HISTORY: 1.  Cholecystectomy.  2.  Hysterectomy.  3.  Recent right total knee replacement.   SOCIAL HISTORY:  The patient lives with her husband, works as a Teacher, early years/pre, no smoking, no illicit drug use.  No alcohol use.   FAMILY HISTORY:  Significant for coronary artery disease in her mother.   ALLERGIES:  PENICILLIN.   HOME MEDICATIONS: 1.  Aspirin 81 mg oral daily.  2.  MS Contin 15 mg extended release 1 to 2 times a day.  3.  Naproxen 500 mg 2 times a day.  4.  Oxycodone 5 mg 2 tablets every four hours.  5.  Gabapentin 100 mg oral 3 times a day.  6.  Metformin 500 mg oral 2 times a day.   7.  Zyrtec 10 mg oral daily.  8.  Promethazine as needed.  9.  Ranitidine 150 mg oral daily.  10.  Ferrous sulfate 325 mg oral daily.  11.  Amlodipine 5 mg oral daily.  12.  Hydrochlorothiazide/lisinopril 12.5/20 mg oral daily.  13.  Estradiol 0.5 mg 1 tablet oral daily.   REVIEW OF SYSTEMS:  CONSTITUTIONAL:  The patient denies fever, chills.  Complains of fatigue, weakness.  Denies weight gain, weight loss.  EYES:  Denies blurry vision, double vision, inflammation, glaucoma.  EARS, NOSE, THROAT:  Denies tinnitus, ear pain, hearing loss, epistaxis or discharge.  RESPIRATORY:  Denies cough, wheezing, hemoptysis, COPD, dyspnea.  CARDIOVASCULAR:  Denies chest pain, orthopnea, edema, arrhythmia, palpitations, syncope.  GASTROINTESTINAL:  Complains of nausea, vomiting, diarrhea, mild abdominal pain.  Denies hematemesis, melena, bright red blood per rectum, jaundice.  GENITOURINARY:   Denies dysuria, hematuria, renal colic.  ENDOCRINE:  Denies polyuria, polydipsia, heat or cold intolerance.  HEMATOLOGY:   Has history of anemia.  Denies easy bruising, bleeding diathesis.  INTEGUMENT:  Denies acne, rash or skin lesions.  MUSCULOSKELETAL:  Denies arthritis, cramps, gout.  decreased mobility secondary to her recent surgery.  NEUROLOGIC:  Denies CVA, TIA, seizures, headache, dementia, ataxia.  PSYCHIATRIC:  Denies anxiety, insomnia, bipolar disorder, nervousness, substance or alcohol abuse.   PHYSICAL EXAMINATION:  VITAL SIGNS:  Temperature 99, pulse 117, respiratory rate 20, blood pressure 160/58, saturating 94% on room air.  GENERAL:  Morbidly obese female looks comfortable in bed in no apparent distress.  HEENT:  Head atraumatic, normocephalic.  Pupils equal, reactive to light.  Pink conjunctivae.  Anicteric sclerae.  Dry oral mucosa.  Has NG tube in left nares in intermittent low suction.  NECK:  Supple.  No thyromegaly.  No JVD.  CHEST:  Good air entry bilaterally.  No wheezing, rales, rhonchi.  CARDIOVASCULAR:  S1, S2 heard.  No rubs, murmurs, gallops.  ABDOMEN:  Mild tenderness in the right side, but no rebound, no guarding.  Bowel sounds hyperperistaltic.  EXTREMITIES:  Has right lower extremity knee immobilizer, but no edema.  No clubbing.  No cyanosis bilaterally and has good dorsalis pedis pulse bilaterally.  PSYCHIATRIC:  Appropriate affect.  Awake, alert x 3.  Intact judgment and insight. NEUROLOGIC:  Cranial nerves grossly intact.  Motor 5 out of 5.  No focal deficits.  SKIN:  Normal skin turgor.  Warm and dry.  LYMPHATICS:  No cervical lymphadenopathy and no supraclavicular lymphadenopathy.   PERTINENT LABORATORY DATA:  Glucose 136, BUN 23, creatinine 1.82, sodium 135, potassium 4.2, chloride 100, CO2 28, magnesium 1.4, lipase 33, total protein 7.1, ALT 27, AST 24, alk phos 86, total CK 245.  Troponin 0.46, CK-MB 1.2.  White blood cells 9.3, hemoglobin 8.2, hematocrit 26.7, platelets 263.  INR 1.2.  C. diff. negative.  CT abdomen and pelvis showing small bowel obstruction with anterior  right pelvis transition point, a question adhesions and small hiatal hernia.  Venous Doppler, no DVT in bilateral lower extremities.   ASSESSMENT AND PLAN: 1.  Small bowel obstruction, the patient presents with nausea, vomiting and mild abdominal pain.  This is related to small bowel obstruction most likely due to adhesions, has much relief after NG tube insertion on suction, seen and evaluated by surgery, I will continue to follow, meanwhile she will be kept nothing by mouth and as per surgery recommendation she will be getting serial KUBs to follow on resolution of her small bowel obstruction.  2.  Elevated troponins, this is unlikely myocardial infarction, especially as she does not have any chest pain, unfortunately there is no old EKG to compare, this is most likely related to demand ischemia and renal failure, she has already received aspirin and subQ Lovenox in the Emergency Department, we will admit patient to telemetry unit.  We will continue to cycle cardiac enzymes.  We will consult cardiology, meanwhile we will hold on  resuming her Lovenox treatment dose until seen by cardiology.  3.  Acute renal failure.  This is due to volume depletion from decreased by mouth intake and diarrhea and vomiting, we will continue with IV fluids.  4.  Anemia.  Hemoglobin dropped from 9 to 8.2, this is most likely anemia from blood loss during surgery.  We will continue with her iron once she is more stable, is able to take by mouth intake.  5.  Tachycardia, hypoxia and recent immobilization, bilateral venous Doppler negative for DVT, received subQ Lovenox treatment dose for possible pulmonary embolus until it is ruled out.  The patient is planned to have VQ scan in a.m.  6.  Hypomagnesemia.  We will replace.   7.  Diabetes mellitus, as the patient is nothing by mouth, we will keep her on fingersticks blood glucose, if fingersticks are uncontrolled we will start her on sliding scale.  8.  DVT prophylaxis.  The  patient currently will be on sequential compression devices and TED hose, she already received one treatment dose of subQ Lovenox 1 mg/kg, we will await cardiology consultation and decrease and V/Q scan before deciding on what anticoagulation dose we will continue, either treatment or prophylaxis dose.  9.  Recent right total knee replacement.  We will consult physical therapy.   Total time spent on admission and patient care 60 minutes.    ____________________________ Albertine Patricia, MD dse:ea D: 09/07/2012 03:53:32 ET T: 09/07/2012 04:38:35 ET JOB#: [id number]  cc: Albertine Patricia, MD, <Dictator> Jhace Fennell Graciela Husbands MD ELECTRONICALLY SIGNED 09/09/2012 6:44

## 2014-06-26 NOTE — Consult Note (Signed)
PATIENT NAME:  Whitney Banks, Whitney Banks MR#:  [id number] DATE OF BIRTH:  1950/09/07  DATE OF CONSULTATION:  09/07/2012  CONSULTING PHYSICIAN:  Dionisio David, MD  HISTORY OF PRESENT ILLNESS: This is a 64 year old African-American female with a past medical history of knee surgery recently at [facility], who came into the hospital with nausea, vomiting and diarrhea. I was asked to evaluate the patient because the patient's EKG was abnormal and had 0.49 troponin level, which was elevated. The patient denies any chest pain, shortness of breath, PND, orthopnea. She does have apparently abdominal pain. Dr. Burt Knack saw her, and he thinks she had intestinal obstruction due to adhesions, and he advised conservative treatment. Right now, she has an NG tube in place, and she is not having surgery. Her other concern was that her creatinine recently has gone up, so she could not have workup for pulmonary embolism.   PAST MEDICAL HISTORY: History of diabetes, hypertension, anemia, hysterectomy, status post right knee replacement.   SOCIAL HISTORY: She denies EtOH abuse or smoking.   FAMILY HISTORY: Her mother had coronary artery disease.   ALLERGIES: PENICILLIN.  MEDICATIONS: Right now, she is just taking aspirin and was just started on Lovenox. Her home medications included amlodipine 5 mg, hydrochlorothiazide/lisinopril 12.5/20 once a day, and she is also on metformin 500 mg 2 times a day.   PHYSICAL EXAMINATION:  GENERAL: She appears to be quite comfortable, alert and oriented, in no acute distress.  VITAL SIGNS: Her blood pressure is 112/76, respirations 18, pulse 120, temperature 98.6.  HEENT: Revealed no JVD.  LUNGS: Clear.  HEART: Regular rate and rhythm. Normal S1, S2. Tachycardic.  ABDOMEN: Soft, nontender, positive bowel sounds.  EXTREMITIES: No pedal edema.  NEUROLOGIC: She appears to be intact.   DIAGNOSTIC STUDIES: EKG shows sinus tachycardia at 144 beats per minute, poor R wave progression,  LVH, nonspecific ST-T changes. Her creatinine was 1.82, BUN was 23. CPK was 245, troponin was 0.46.   ASSESSMENT AND PLAN: The patient has elevated troponin, about 0.46, which can be due to renal insufficiency versus non-ST-elevation myocardial infarction versus due to pulmonary embolism. She is getting V/Q scan. Advised continuation of Lovenox until things are a little bit more clear. She is currently denying any chest pain or shortness of breath. I feel that, however, given there is no gastrointestinal bleed, I would advise continuation of Lovenox until it becomes further clear that she does not have pulmonary embolism, and if the pulmonary embolism has been ruled out and troponins are trending down, advise discontinuation of Lovenox.  Thank you very much for referral.   ____________________________ Dionisio David, MD sak:OSi D: 09/07/2012 09:37:54 ET T: 09/07/2012 09:48:58 ET JOB#: [id number]  cc: Dionisio David, MD, <Dictator> Dionisio David MD ELECTRONICALLY SIGNED 09/10/2012 16:32

## 2014-06-26 NOTE — Consult Note (Signed)
Brief Consult Note: Diagnosis: nausea, vomiting, SBO vs Ileus.   Patient was seen by consultant.   Consult note dictated.   Discussed with Attending MD.   Comments: Patient seen and examined. Patient with persistent nausea and vomiting. CT scan suggested SBO, and serial abdominal xrays have confirmed persistence. Most recent flat plate this am reveals very minimal decrease in air fluid level, but no real chnge to the dilated loops of small bowel. Continued to have vomiting this morning. Loose watery BM yesterday, passing flatus today. Was refusing NGT but has expressed willingness today. Just had NGT placed minutes prior to our discussion.  Recc continues NGT, NPO, IVF. Likely n/v secondary to persist obstruction/ileus noted on xray. Continue antiemetics, symptomatic management. Will see how she responds with NPO status and NGT. No role for EGD at the current time due to obstruction. Will follow. Full consult dictated.  Electronic Signatures: Sherald Barge (PA-C)  (Signed 09-Jul-14 13:16)  Authored: Brief Consult Note   Last Updated: 09-Jul-14 13:16 by Sherald Barge (PA-C)

## 2014-06-26 NOTE — Consult Note (Signed)
PATIENT NAME:  Whitney Banks, Whitney Banks MR#:  [id number] DATE OF BIRTH:  15-Jul-1950  DATE OF CONSULTATION:  09/11/2012  CONSULTING PHYSICIAN:  Corky Sox. Joshlynn Alfonzo, PA-C  REFERRING PHYSICIAN: Dr Bridgette Habermann  REASON FOR CONSULTATION: Nausea and vomiting.   HISTORY OF PRESENT ILLNESS: This is a pleasant 64 year old African American female who initially presented to the Emergency Department with concerns of nausea and vomiting with some mild diarrhea. Of note, she is recently postop from a right total knee replacement at [facility] that was done about a week or two ago and she was recently discharged from [facility] on 09/03/2012. She was home for a few days when she began noticing bad abdominal pain, nausea, and vomiting with some looser stools. She presents to the Emergency Department where a CT scan was done suggestive of a small bowel obstruction versus ileus. Since being admitted she continues to have loose watery bowel movements. She has not had one yet today, but does admit to passing flatness today. The nausea and vomiting, however, has persisted. Previously she has been refusing an NG tube, but has agreed and expressed willingness earlier today to have one placed. Just minutes before our discussion, she did have the NG tube put in place and everything went smooth. Most recent abdominal x-ray including a flat plate and upright was obtained this morning, and it does show persistence of the small bowel obstruction. There was slight improvement of the air fluid levels; however, there was very little change from the prior exam and there was still persistently dilated loops of small bowel present. She denies noticing any black or bloody stools. She denies any coffee-ground emesis or any evidence of blood. There is no fever, chills. There is no chest pain or shortness of breath. LFTs were within normal limits outside of a very slightly elevated bilirubin of 1.5. She was also evaluated for possible PE and VQ scan was negative. Because of  her looser stools, she was also checked for C. difficile, which was also negative.   PAST MEDICAL HISTORY: Hypertension, diabetes mellitus, history of chronic anemia.   PAST SURGICAL HISTORY: Hysterectomy, cholecystectomy and a recent right total knee replacement just two weeks ago at [facility]   ALLERGIES: PENICILLIN.   HOME MEDICATIONS: Zyrtec, promethazine, ranitidine, ferrous sulfate, amlodipine, hydrochlorothiazide, lisinopril, estradiol, metformin, oxycodone gabapentin, naproxen, MS Contin and aspirin.   SOCIAL HISTORY: The patient denies any alcohol, tobacco or illicit drug use. She lives at home with her husband.   FAMILY HISTORY: There is no known family history of GI malignancy, colon polyps or IBD.   REVIEW OF SYSTEMS:  A 10 system review of systems was obtained on the patient. Pertinent positives are mentioned above and otherwise negative.   OBJECTIVE: VITAL SIGNS: Blood pressure 148/88, heart rate 97, respirations 17, temperature 98 degrees, bedside pulse oximetry 97%.  GENERAL: This is a pleasant 64 year old female resting quietly and comfortably in the exam room, in no acute distress. Alert and oriented x3.  HEAD: Atraumatic, normocephalic.  NECK: Supple. No lymphadenopathy noted.  HEENT: Sclerae anicteric. Mucous membranes moist. NG tube noted in place with bilious  yellow output.   PULMONARY: Respirations are even and unlabored, clear to auscultation, bilateral anterior lung fields.  CARDIAC: Regular rate and rhythm. S1, S2 noted.  ABDOMEN: Soft, nontender, nondistended. Normoactive bowel sounds are noted in all four quadrants. No masses, hernias or organomegaly appreciated.  EXTREMITIES: Negative for lower extremity edema, 2+ pulses noted bilaterally.  PSYCHIATRIC: Appropriate mood and affect.   LABORATORY DATA: White  blood cells 18.0, hemoglobin 9.7, hematocrit 30.0, MCV 60, platelets 412. Sodium 140 potassium 3.3, BUN 18, creatinine 0.89, glucose 97, bilirubin 1.5, alkaline  phosphatase 110, ALT 40, AST 39, albumin 2.5. PT 15.1. INR 1.2, PTT 32.5, troponin 0.03.   IMAGING:  VQ scan was obtained to rule out a PE and was negative.   A CT of the chest without contrast was obtained on the patient showing no evidence of a mediastinal mass or lymphadenopathy. There were trace pleural effusions. There was an indeterminate 4 mm nodule in the right middle lobe.   CT of the abdomen and pelvis without contrast was obtained on the patient showing findings consistent with a small bowel obstruction.   Serial abdominal x-rays have been obtained on the patient since being admitted and most recently this morning on July 9th, findings were once again concerning for small bowel obstruction. There was very slight decrease in the amount of air fluid levels when compared to previous exam but the dilated loops of small bowel were persistent and unchanged.   ASSESSMENT: 1.  Small bowel obstruction persistent on repeat abdominal x-ray this morning.  2.  Nausea and vomiting has continued through this morning. Recently had an NG tube placed.  3.  Recent right total knee replacement at [facility], recently discharged 09/03/2012.  4. Acute renal failure, likely secondary to volume depletion from poor p.o. intake with accompanying persistent vomiting.   PLAN: I have discussed this patient's case in detail with Dr. Verdie Shire, who is involved in the development of the patient's plan of care. At this time we are highly suspicious that her persistent nausea and vomiting are secondary to the small bowel obstruction. Although she is passing flatus and stool, the abdominal x-ray obtained this morning does still show persistence of the obstruction. Therefore, we do agree with the NG tube being placed and we will monitor her closely to see how she responds with intermittent suction. Continue symptomatic management with antiemetics and pain control as needed. Continue IV fluids, as the patient should be kept n.p.o. At  this time we will continue to manage conservatively and appreciate surgical recommendations as well pending her clinical status and response to the NG tube. Currently, there is no clinical role for an EGD with coexisting obstruction. Certainly, should nausea and vomiting persist once the obstruction has resolved, we can consider further intervention at that time. All of this was discussed in detail with the patient and the patient's family member in the room and all questions were answered.   Thank you so much for this consultation and for allowing [place] to participate in the patient's plan of care.   ATTENDING GASTROENTEROLOGIST:  Verdie Shire, MD.  ____________________________ Corky Sox. Ayrton Mcvay, PA-C kme:dp D: 09/11/2012 13:38:00 ET T: 09/11/2012 15:11:20 ET JOB#: [id number]  cc: Corky Sox. Zully Frane, PA-C, <Dictator> [place] PA ELECTRONICALLY SIGNED 09/16/2012 14:15

## 2014-06-26 NOTE — Consult Note (Signed)
Consult called to me after 6pm, and is routine.  Please submit consult for tomorrow morning.  Electronic Signatures: Manya Silvas (MD)  (Signed on 08-Jul-14 20:44)  Authored  Last Updated: 08-Jul-14 20:44 by Manya Silvas (MD)

## 2014-06-26 NOTE — Consult Note (Signed)
Chief Complaint:  Subjective/Chief Complaint Pt voices frustration with having to see "too many doctors".  "I only need 1 doctor!"  She has good appetite, passing flatus.  "Ready to go Home".  Denies nausea.  Requesting pain meds for abdominal pain.   VITAL SIGNS/ANCILLARY NOTES: **Vital Signs.:   11-Jul-14 08:01  Vital Signs Type Routine  Temperature Temperature (F) 98.2  Celsius 36.7  Pulse Pulse 128  Respirations Respirations 18  Systolic BP Systolic BP 030  Diastolic BP (mmHg) Diastolic BP (mmHg) 87  Mean BP 104  Pulse Ox % Pulse Ox % 97  Pulse Ox Activity Level  At rest  Oxygen Delivery Room Air/ 21 %   Brief Assessment:  GEN well developed, no acute distress, Husband @ bedside.   Cardiac Regular   Respiratory normal resp effort   Gastrointestinal Normal   Gastrointestinal details normal Soft  Nontender  Nondistended  faint BS   Additional Physical Exam HEENT: NGT intact w/ lg brown gastric contents in suction canister Skin: warm, dry, intact   Lab Results: Routine Chem:  11-Jul-14 04:50   Glucose, Serum 86  BUN 10  Creatinine (comp) 0.90  Sodium, Serum 141  Potassium, Serum 3.6  Chloride, Serum 107  CO2, Serum 26  Calcium (Total), Serum  8.3  Anion Gap 8  Osmolality (calc) 280  eGFR (African American) >60  eGFR (Non-African American) >60 (eGFR values <75m/min/1.73 m2 may be an indication of chronic kidney disease (CKD). Calculated eGFR is useful in patients with stable renal function. The eGFR calculation will not be reliable in acutely ill patients when serum creatinine is changing rapidly. It is not useful in  patients on dialysis. The eGFR calculation may not be applicable to patients at the low and high extremes of body sizes, pregnant women, and vegetarians.)  Routine Hem:  11-Jul-14 04:50   WBC (CBC)  14.9  RBC (CBC) 4.49  Hemoglobin (CBC)  8.6  Hematocrit (CBC)  27.3  Platelet Count (CBC)  448 (Result(s) reported on 13 Sep 2012 at 06:12AM.)   MCV  61  MCH  19.2  MCHC  31.6  RDW  18.3  Bands 2  Segmented Neutrophils 74  Lymphocytes 16  Monocytes 3  Eosinophil 3  Metamyelocyte 1  Myelocyte 1  Diff Comment 1 HYPOCHROMIA  Diff Comment 2 PLTS VARIED IN SIZE  Diff Comment 3 LARGE PLATELETS  Result(s) reported on 13 Sep 2012 at 06:12AM.   Radiology Results: XRay:    10-Jul-14 07:55, Abdomen Flat and Erect  Abdomen Flat and Erect   REASON FOR EXAM:    Follow up on small bowel obstruction post NG tube   insertion  COMMENTS:       PROCEDURE: DXR - DXR ABDOMEN 2 V FLAT AND ERECT  - Sep 12 2012  7:55AM     RESULT: Comparison is made to study of 09/11/2012.    There are some distendedloops of small bowel persisting. These appear to   be slightly less prominent compare to the previous study. Is a moderate   amount of air in the stomach. A nasogastric tube is present. The tip is   in the body the stomach. Subhepatic surgical staples are present. There   is no pneumatosis or free air evident. There is air in the rectum. There   is scattered air in the colon.  IMPRESSION:  Findings suggestive of ileus. A definite obstruction is not   completely demonstrated. There is slight decrease in the size of the  small bowel loops compared to previous images.    Dictation Site: 2        Verified By: Sundra Aland, M.D., MD   Assessment/Plan:  Assessment/Plan:  Assessment Ileus: Resolving with NGT in place.  Pt hungry, good BS, passing flatus.   Plan 1) NGT to be discontinued 2) Trial clear liquids 3) Dr Allen Norris to follow over the weekend   Electronic Signatures: Andria Meuse (NP)  (Signed 11-Jul-14 10:42)  Authored: Chief Complaint, VITAL SIGNS/ANCILLARY NOTES, Brief Assessment, Lab Results, Radiology Results, Assessment/Plan   Last Updated: 11-Jul-14 10:42 by Andria Meuse (NP)

## 2014-06-26 NOTE — Discharge Summary (Signed)
PATIENT NAME:  Whitney Banks, Whitney Banks MR#:  [id number] DATE OF BIRTH:  [birth year]  DATE OF ADMISSION:  09/07/2012  DATE OF DISCHARGE:  09/15/2012  For a detailed note, please see the History and Physical done on admission by Dr. Waldron Labs.  DIAGNOSES AT DISCHARGE ARE AS FOLLOWS: 1.  Small bowel obstruction.  2.  Abdominal pain, nausea, vomiting secondary to small bowel obstruction, now improved.  3.  Elevated troponin, likely in the setting of demand ischemia.  4.  Status post recent right knee replacement.  5.  Hypertension.  6.  Diabetes.  7.  Acute renal failure. 8.  Chronic anemia, which was symptomatic, status post posttransfusion.   INSTRUCTIONS: The patient is being discharged on American Diabetic Association low sodium, low fat diet. Activity is as tolerated. The patient is being discharged with home health physical therapy and occupational therapy services. Followup is in the next 1 to 2 weeks with Dr. Delight Stare, the patient's primary care physician.  DISCHARGE MEDICATIONS:  Metformin 5 mg b.i.d., hydrochlorothiazide/lisinopril 12.5/20, 1 tab b.i.d., iron sulfate 325 mg daily, promethazine 12.5 mg q. 6 hours as needed, aspirin 81 mg daily, amlodipine 5 mg daily, Zyrtec 10 mg daily, estradiol 0.5 mg daily, ranitidine 150 mg daily, Tylenol with hydrocodone 5/325, 1 tab q. 6 hours as needed for pain.   CONSULTANTS DURING THE HOSPITAL COURSE: Dr. Leanora Cover from General Surgery, Dr. Sherri Rad from General Surgery, Dr. Neoma Laming from Cardiology.   PERTINENT STUDIES DONE DURING HOSPITAL COURSE ARE AS FOLLOWS: A CT scan of the chest done without contrast on admission showing no mediastinal or mass or lymphadenopathy, trace bilateral pleural effusions, indeterminate 4 mm nodule in the right middle lobe. Followup with a CT is recommended in 12 months.   An abdomen flat and erect done on July 6 showing small bowel distention similar to slightly increased from prior findings concerning for  persistent small bowel obstruction.   A nuclear medicine V/Q scan done on July 5 showing low probability for pulmonary embolism.   An ultrasound of the lower extremities showing no evidence of DVT in bilateral lower extremities.   HOSPITAL COURSE:  This is a 64 year old female with medical problems as mentioned above, presented to the hospital on 09/07/2012 secondary to nausea/vomiting, and CT findings suggestive of a small bowel obstruction. The patient was also noted to be in acute renal failure with elevated troponin, and noted to be hypoxic and tachycardic.   1. Small bowel obstruction. This was likely the cause of patient's abdominal pain, nausea, vomiting. The patient was recently at [facility] for a right knee replacement. She was discharged home with home health physical therapy. She apparently had not had a bowel movement prior to coming in. Her CT abdomen and pelvis was suggestive of ileus/early small bowel obstruction. The patient was treated conservatively with NG tube decompressions, antiemetics, IV fluids, and pain control. A surgical consult was obtained. Serial abdominal films were followed, and patient's clinical condition has significantly improved. She has now been able to tolerate a regular diet and has had a BM, and her small bowel obstruction has now resolved. She is, therefore, being discharged home.   2.  Shortness of breath. This was likely in the setting of symptomatic anemia. The patient was transfused 1 unit of packed red blood cells, and her shortness of breath improved. There was also some concern for a possible pulmonary embolism, as she had recently had a right knee replacement. We could not do  a CT of her chest with contrast on admission because of her acute renal failure. Therefore, she underwent a V/Q scan, which showed a low probability for PE. After transfusion and treatment for her bowel obstruction, her shortness of breath has significantly  improved and now back to baseline.   3.  Elevated troponin. This was likely in the setting of demand ischemia from, and also due to poor renal clearance from her renal failure. The patient was seen by Cardiology, they did not want to do any acute intervention. Her echocardiogram showed normal LV function with no wall motion abnormalities. She was maintained on some aspirin. The patient is to continue followup with Cardiology as an outpatient. She had no evidence of acute coronary syndrome.   4.  Acute renal failure. This was likely secondary to volume depletion and from her small bowel obstruction. She was given aggressive IV fluids, and her renal function is now back down to baseline.   5.  Lung nodule. This was incidentally noted on the CT of the chest noncontrast. She likely should have a repeat CT scan done in 12 months just to make sure that the nodule has not gotten bigger and has resolved.   6.  Hypomagnesemia and hypokalemia. This was secondary to small bowel obstruction and poor p.o. intake. This has since then been supplemented, and now resolved.   7.  Diabetes. While in the hospital, patient was maintained on insulin sliding scale, and her metformin was held due to renal failure and poor p.o. intake, although she can resume her metformin upon discharge, as her renal function has improved and she is taking p.o. intake well for now.   8.  Hypertension. The patient remained hemodynamically stable. Her lisinopril was initially held due to renal failure, although she will resume that upon discharge along with her Norvasc.   The patient is being discharged home with home health physical therapy and occupational therapy services as mentioned.   Time spent on discharge is 40 minutes.    ____________________________ Belia Heman. Verdell Carmine, MD vjs:mr D: 09/15/2012 13:34:00 ET T: 09/15/2012 19:43:47 ET JOB#: [id number]  cc: Belia Heman. Verdell Carmine, MD, <Dictator> Marguerita Merles, MD  Henreitta Leber  MD ELECTRONICALLY SIGNED 09/16/2012 20:44

## 2014-06-26 NOTE — Consult Note (Signed)
Brief Consult Note: Diagnosis: partial SBO.   Patient was seen by consultant.   Consult note dictated.   Recommend further assessment or treatment.   Orders entered.   Discussed with Attending MD.   Comments: possible partial SBO, postop 5 days postop rt total knee repl. Also has tachypnea, tachycardia and diarrhea with mild hypoxemia. Current w/u for PE in progress. Agree with PD/IM admission. We will follow and reexamine with serial KUB.  Electronic Signatures: Florene Glen (MD)  (Signed 05-Jul-14 01:07)  Authored: Brief Consult Note   Last Updated: 05-Jul-14 01:07 by Florene Glen (MD)

## 2014-06-26 NOTE — Consult Note (Signed)
PATIENT NAME:  Whitney Banks, ROZAS MR#:  [id number] DATE OF BIRTH:  1950/08/14  DATE OF CONSULTATION:  09/07/2012  REFERRING PHYSICIAN:   CONSULTING PHYSICIAN:  Jerrol Banana. Burt Knack, MD   CHIEF COMPLAINT: Nausea, vomiting and diarrhea.   HISTORY OF PRESENT ILLNESS: I was asked to see this patient in the Emergency Room for a possible diagnosis of partial small bowel obstruction. The patient describes some abdominal pain, nausea, vomiting. She has actually thrown up close to 20 times and loose stools with the passage of flatus. This started yesterday, somewhat acutely. She had been doing well prior to that. She is, of note, in the immediate postoperative period, 5 days postop, right total knee replacement at [facility]. She was not on anticoagulation other than aspirin following that surgery and the patient is currently undergoing a work up for pulmonary embolus. She does have some renal failure, which is preventing the use of IV contrast on a CT scan and a V/Q scan is to be performed later this evening.   The patient describes diffuse abdominal pain, points more to the left side and has vomited multiple times. She states she has had 6 loose stools yesterday and passed some flatus with it.   PAST MEDICAL HISTORY: Diabetes, hypertension. Denies stroke or myocardial infarction.   PAST SURGICAL HISTORY: Recent a total knee replacement at [facility], 5 days postop. She also has a past surgical history of open cholecystectomy and hysterectomy, not sure if her ovaries had been removed.   ALLERGIES:  PENICILLIN.    MEDICATIONS: See medication reconciliation sheet.  REVIEW OF SYSTEMS: A 10-system review is performed and negative, with the exception of that mentioned in the HPI.   SOCIAL HISTORY: The patient does not smoke or drink. She drives a school bus.    FAMILY HISTORY: Noncontributory.   PHYSICAL EXAMINATION: GENERAL: Somewhat uncomfortable-appearing black female patient.  VITAL SIGNS: Temperature  is 99.0, pulse of 107 was 144, respirations 22 to 24, blood pressure 95/68, pain scale of 5, 97% sats on supplemental oxygen.  HEENT: No scleral icterus.  NECK: No palpable neck nodes.  CHEST: Clear to auscultation.  CARDIAC: Regular rate and rhythm.  ABDOMEN: Shows a right upper quadrant Kocher incision, minimally distended, minimally tympanitic, very minimally diffusely tender, maximally on the left upper quadrant. No peritoneal signs. No guarding, rebound or percussion tenderness.  EXTREMITIES: Are without edema. The right lower extremity is in an immobilization splint and cannot be easily examined. The left calf is nontender.  INTEGUMENT: Shows no jaundice.  NEUROLOGIC: Grossly intact.   DATABASE: CT scan has been personally reviewed. There appears to be dilated small bowel and possibly collapsed small bowel distally. There is minimal gas in the rectum with liquid stool present.   White blood cell count is 9.3, H and H of 8.2 and 27, with a platelet count of 263. Electrolytes are within normal limits. Sodium is at 135, creatinine is 1.82 with a BUN of 23 and a glucose of 136. Her CK total is 245 and troponins are elevated at 0.46 with an albumin of 2.6. Normal liver function tests.   ASSESSMENT AND PLAN: This is a patient with multiple problems. Her presenting complaint is abdominal pain, nausea, vomiting and diarrhea and a work-up for that noncontrasted CT suggests partial small bowel obstruction. I was asked to see the patient for this condition.   The patient also has signs highly suspicious for a pulmonary embolus in the immediate postoperative period following a total joint replacement done  at [facility]. She is currently in the workup for that, cannot have a contrasted CT scan and a V/Q scan is currently pending. I discussed with the Emergency Room physician potentially utilizing a Doppler scan of her lower extremities as a presumptive sign of a pulmonary embolus, if deep venous thrombosis  is noted, and the physicians considered doing that as well.   The patient is to be admitted to the hospital by [facility] and I  would be happy to follow the patient while she is in the hospital. A nasogastric tube has been ordered by the Emergency Room physician, but not yet placed. Serial examinations and serial KUBs will be necessary and I discussed the plan with the patient and her significant other.    ____________________________ Jerrol Banana. Burt Knack, MD rec:nts D: 09/07/2012 01:17:20 ET T: 09/07/2012 02:10:28 ET JOB#: [id number]  cc: Jerrol Banana. Burt Knack, MD, <Dictator> Florene Glen MD ELECTRONICALLY SIGNED 09/07/2012 19:22

## 2014-06-26 NOTE — Consult Note (Signed)
Pt seen and examined. Please see [facility] Sos notes. N/V/D. NG placed earlier this afternoon. Starting to feel better. Mild abd pain.  pSBO vs ileus. Will see how she does with NG suction over the next day or two. Will follow. Thanks.  Electronic Signatures: Verdie Shire (MD)  (Signed on 09-Jul-14 15:37)  Authored  Last Updated: 09-Jul-14 15:37 by Verdie Shire (MD)

## 2014-06-26 NOTE — Consult Note (Signed)
Chief Complaint:  Subjective/Chief Complaint Starting to feel better with NG suction. Less nausea. Stool neg for c.diff. Had BM this AM.   VITAL SIGNS/ANCILLARY NOTES: **Vital Signs.:   10-Jul-14 07:35  Vital Signs Type Routine  Temperature Temperature (F) 97.7  Celsius 36.5  Temperature Source oral  Pulse Pulse 95  Respirations Respirations 17  Systolic BP Systolic BP 867  Diastolic BP (mmHg) Diastolic BP (mmHg) 84  Mean BP 98  Pulse Ox % Pulse Ox % 99  Pulse Ox Activity Level  At rest  Oxygen Delivery Room Air/ 21 %   Brief Assessment:  GEN no acute distress   Cardiac Regular   Respiratory clear BS   Gastrointestinal Normal   Lab Results: Routine UA:  10-Jul-14 09:26   Color (UA) AMBER  Clarity (UA) CLOUDY  Glucose (UA) NEGATIVE  Bilirubin (UA) NEGATIVE  Ketones (UA) 1+  Specific Gravity (UA) 1.023  Blood (UA) NEGATIVE  pH (UA) 6.0  Protein (UA) 30 mg/dL  Nitrite (UA) NEGATIVE  Leukocyte Esterase (UA) 2+ (Result(s) reported on 12 Sep 2012 at 10:24AM.)  RBC (UA) 5 /HPF  WBC (UA) 10 /HPF  Bacteria (UA) 1+  Epithelial Cells (UA) 72 /HPF  Mucous (UA) PRESENT  Budding Yeast (UA) PRESENT (Result(s) reported on 12 Sep 2012 at 10:24AM.)   Radiology Results: XRay:    10-Jul-14 07:55, Abdomen Flat and Erect  Abdomen Flat and Erect   REASON FOR EXAM:    Follow up on small bowel obstruction post NG tube   insertion  COMMENTS:       PROCEDURE: DXR - DXR ABDOMEN 2 V FLAT AND ERECT  - Sep 12 2012  7:55AM     RESULT: Comparison is made to study of 09/11/2012.    There are some distendedloops of small bowel persisting. These appear to   be slightly less prominent compare to the previous study. Is a moderate   amount of air in the stomach. A nasogastric tube is present. The tip is   in the body the stomach. Subhepatic surgical staples are present. There   is no pneumatosis or free air evident. There is air in the rectum. There   is scattered air in the  colon.  IMPRESSION:  Findings suggestive of ileus. A definite obstruction is not   completely demonstrated. There is slight decrease in the size of the   small bowel loops compared to previous images.    Dictation Site: 2        Verified By: Sundra Aland, M.D., MD   Assessment/Plan:  Assessment/Plan:  Assessment Prob ileus. Overall improving. Stool neg for c.diff.   Plan Continue NG suction. As KUB shows improvement, can clamp NG later and see how she does. I will be out tomorrow and the weekend. Dr. Allen Norris will cover. thanks.   Electronic Signatures: Verdie Shire (MD)  (Signed 10-Jul-14 14:01)  Authored: Chief Complaint, VITAL SIGNS/ANCILLARY NOTES, Brief Assessment, Lab Results, Radiology Results, Assessment/Plan   Last Updated: 10-Jul-14 14:01 by Verdie Shire (MD)

## 2014-09-14 ENCOUNTER — Ambulatory Visit
Admission: RE | Admit: 2014-09-14 | Discharge: 2014-09-14 | Disposition: A | Discharge status: Home / Self Care | Payer: Self-pay | Source: Ambulatory Visit | Attending: Oncology | Admitting: Oncology

## 2014-09-14 ENCOUNTER — Ambulatory Visit: Payer: Self-pay | Attending: Oncology | Admitting: *Deleted

## 2014-09-14 ENCOUNTER — Encounter: Payer: Self-pay | Admitting: *Deleted

## 2014-09-14 VITALS — BP 138/86 | HR 62 | Temp 96.6°F | Resp 18 | Ht 60.24 in | Wt 231.9 lb

## 2014-09-14 DIAGNOSIS — Z0000 Encounter for general adult medical examination without abnormal findings: Secondary | ICD-10-CM

## 2014-09-14 NOTE — Progress Notes (Signed)
Subjective:     Patient ID: Javier Docker, female   DOB: 1951-02-17, 64 y.o.   MRN: [id number]  HPI   Review of Systems     Objective:   Physical Exam  Pulmonary/Chest: Right breast exhibits no inverted nipple, no mass, no nipple discharge, no skin change and no tenderness. Left breast exhibits no inverted nipple, no mass, no nipple discharge, no skin change and no tenderness. Breasts are symmetrical.       Assessment:      64 year old Black female presents to [facility] for clinical breast exam and mammogram.  Clinical breast exam unremarkable.  Taught self breast awareness.  Patient has been screened for eligibility.  She does not have any insurance, Medicare or Medicaid.  She also meets financial eligibility.  Hand-out given on the Affordable Care Act.    Plan:  Screening mammogram ordered.  To follow-up per [facility] protocol.

## 2014-09-15 ENCOUNTER — Encounter: Payer: Self-pay | Admitting: *Deleted

## 2014-09-15 NOTE — Progress Notes (Signed)
Letter mailed from the [facility] to inform patient of her normal mammogram results.  Patient is to follow-up with annual screening in one year. 

## 2015-03-23 DIAGNOSIS — L209 Atopic dermatitis, unspecified: Secondary | ICD-10-CM | POA: Insufficient documentation

## 2015-04-30 DIAGNOSIS — R928 Other abnormal and inconclusive findings on diagnostic imaging of breast: Secondary | ICD-10-CM | POA: Insufficient documentation

## 2015-05-06 ENCOUNTER — Encounter: Payer: Self-pay | Admitting: *Deleted

## 2015-05-07 ENCOUNTER — Ambulatory Visit
Admission: RE | Admit: 2015-05-07 | Discharge: 2015-05-07 | Disposition: A | Discharge status: Home / Self Care | Payer: BLUE CROSS/BLUE SHIELD | Source: Ambulatory Visit | Attending: Gastroenterology | Admitting: Gastroenterology

## 2015-05-07 ENCOUNTER — Encounter: Payer: Self-pay | Admitting: Anesthesiology

## 2015-05-07 ENCOUNTER — Ambulatory Visit: Payer: BLUE CROSS/BLUE SHIELD | Admitting: Anesthesiology

## 2015-05-07 ENCOUNTER — Encounter
Admission: RE | Disposition: A | Discharge status: Home / Self Care | Payer: Self-pay | Source: Ambulatory Visit | Attending: Gastroenterology

## 2015-05-07 DIAGNOSIS — D123 Benign neoplasm of transverse colon: Secondary | ICD-10-CM | POA: Insufficient documentation

## 2015-05-07 DIAGNOSIS — E669 Obesity, unspecified: Secondary | ICD-10-CM | POA: Insufficient documentation

## 2015-05-07 DIAGNOSIS — D509 Iron deficiency anemia, unspecified: Secondary | ICD-10-CM | POA: Diagnosis not present

## 2015-05-07 DIAGNOSIS — K222 Esophageal obstruction: Secondary | ICD-10-CM | POA: Insufficient documentation

## 2015-05-07 DIAGNOSIS — Z7984 Long term (current) use of oral hypoglycemic drugs: Secondary | ICD-10-CM | POA: Insufficient documentation

## 2015-05-07 DIAGNOSIS — Z7982 Long term (current) use of aspirin: Secondary | ICD-10-CM | POA: Diagnosis not present

## 2015-05-07 DIAGNOSIS — K449 Diaphragmatic hernia without obstruction or gangrene: Secondary | ICD-10-CM | POA: Insufficient documentation

## 2015-05-07 DIAGNOSIS — R1013 Epigastric pain: Secondary | ICD-10-CM | POA: Insufficient documentation

## 2015-05-07 DIAGNOSIS — K219 Gastro-esophageal reflux disease without esophagitis: Secondary | ICD-10-CM | POA: Diagnosis not present

## 2015-05-07 DIAGNOSIS — R1310 Dysphagia, unspecified: Secondary | ICD-10-CM | POA: Diagnosis not present

## 2015-05-07 DIAGNOSIS — Z79899 Other long term (current) drug therapy: Secondary | ICD-10-CM | POA: Diagnosis not present

## 2015-05-07 DIAGNOSIS — K2950 Unspecified chronic gastritis without bleeding: Secondary | ICD-10-CM | POA: Diagnosis not present

## 2015-05-07 DIAGNOSIS — K648 Other hemorrhoids: Secondary | ICD-10-CM | POA: Insufficient documentation

## 2015-05-07 DIAGNOSIS — I10 Essential (primary) hypertension: Secondary | ICD-10-CM | POA: Insufficient documentation

## 2015-05-07 DIAGNOSIS — Z6841 Body Mass Index (BMI) 40.0 and over, adult: Secondary | ICD-10-CM | POA: Insufficient documentation

## 2015-05-07 DIAGNOSIS — Z880 Allergy status to penicillin: Secondary | ICD-10-CM | POA: Insufficient documentation

## 2015-05-07 DIAGNOSIS — E119 Type 2 diabetes mellitus without complications: Secondary | ICD-10-CM | POA: Insufficient documentation

## 2015-05-07 HISTORY — PX: ESOPHAGOGASTRODUODENOSCOPY (EGD) WITH PROPOFOL: SHX5813

## 2015-05-07 HISTORY — PX: COLONOSCOPY WITH PROPOFOL: SHX5780

## 2015-05-07 LAB — GLUCOSE, CAPILLARY: GLUCOSE-CAPILLARY: 97 mg/dL (ref 65–99)

## 2015-05-07 SURGERY — ESOPHAGOGASTRODUODENOSCOPY (EGD) WITH PROPOFOL
Anesthesia: General

## 2015-05-07 MED ORDER — FENTANYL CITRATE (PF) 100 MCG/2ML IJ SOLN
INTRAMUSCULAR | Status: DC | PRN
  Administered 2015-05-07: 50 ug via INTRAVENOUS

## 2015-05-07 MED ORDER — EPHEDRINE SULFATE 50 MG/ML IJ SOLN
INTRAMUSCULAR | Status: DC | PRN
  Administered 2015-05-07: 5 mg via INTRAVENOUS

## 2015-05-07 MED ORDER — LIDOCAINE HCL (PF) 1 % IJ SOLN
2.0000 mL | Freq: Once | INTRAMUSCULAR | Status: AC
  Administered 2015-05-07: 0.03 mL via INTRADERMAL

## 2015-05-07 MED ORDER — LIDOCAINE HCL (PF) 1 % IJ SOLN
INTRAMUSCULAR | Status: AC
  Administered 2015-05-07: 0.03 mL via INTRADERMAL
  Filled 2015-05-07: qty 2

## 2015-05-07 MED ORDER — PROPOFOL 10 MG/ML IV BOLUS
INTRAVENOUS | Status: DC | PRN
  Administered 2015-05-07: 40 mg via INTRAVENOUS

## 2015-05-07 MED ORDER — PROPOFOL 500 MG/50ML IV EMUL
INTRAVENOUS | Status: DC | PRN
  Administered 2015-05-07: 120 ug/kg/min via INTRAVENOUS

## 2015-05-07 MED ORDER — MIDAZOLAM HCL 5 MG/5ML IJ SOLN
INTRAMUSCULAR | Status: DC | PRN
  Administered 2015-05-07: 1 mg via INTRAVENOUS

## 2015-05-07 MED ORDER — SODIUM CHLORIDE 0.9 % IV SOLN
INTRAVENOUS | Status: DC
  Administered 2015-05-07: 1000 mL via INTRAVENOUS

## 2015-05-07 MED ORDER — SODIUM CHLORIDE 0.9 % IV SOLN: INTRAVENOUS | Status: DC

## 2015-05-07 NOTE — Transfer of Care (Signed)
Immediate Anesthesia Transfer of Care Note  Patient: Whitney Banks  Procedure(s) Performed: Procedure(s): ESOPHAGOGASTRODUODENOSCOPY (EGD) WITH PROPOFOL (N/A) COLONOSCOPY WITH PROPOFOL (N/A)  Patient Location: PACU and Endoscopy Unit  Anesthesia Type:General  Level of Consciousness: awake  Airway & Oxygen Therapy: Patient Spontanous Breathing and Patient connected to nasal cannula oxygen  Post-op Assessment: Report given to RN and Post -op Vital signs reviewed and stable  Post vital signs: Reviewed and stable  Last Vitals:  Filed Vitals:   05/07/15 0736 05/07/15 0903  BP: 148/76   Pulse: 72   Temp: 36.9 C 36 C  Resp: 20     Complications: No apparent anesthesia complications

## 2015-05-07 NOTE — Anesthesia Preprocedure Evaluation (Signed)
Anesthesia Evaluation  Patient identified by MRN, date of birth, ID band Patient awake    Reviewed: Allergy & Precautions, H&P , NPO status , Patient's Chart, lab work & pertinent test results  History of Anesthesia Complications Negative for: history of anesthetic complications  Airway Mallampati: III  TM Distance: >3 FB Neck ROM: limited    Dental  (+) Poor Dentition, Missing, Upper Dentures, Lower Dentures   Pulmonary neg shortness of breath,    Pulmonary exam normal breath sounds clear to auscultation       Cardiovascular Exercise Tolerance: Good hypertension, (-) angina(-) Past MI and (-) DOE Normal cardiovascular exam Rhythm:regular Rate:Normal     Neuro/Psych negative neurological ROS  negative psych ROS   GI/Hepatic Neg liver ROS, GERD  Controlled,  Endo/Other  diabetes, Type 2  Renal/GU negative Renal ROS  negative genitourinary   Musculoskeletal   Abdominal   Peds  Hematology negative hematology ROS (+)   Anesthesia Other Findings Past Medical History:   HTN (hypertension)                                           Obesity, Class III, 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*              DM (diabetes mellitus) (HCC)                                 Anemia                                                       Gallstones                                                  Past Surgical History:   PARTIAL HYSTERECTOMY                                          gallstones                                                   BMI    Body Mass Index   44.52 kg/m 2      Reproductive/Obstetrics negative OB ROS                             Anesthesia Physical Anesthesia Plan  ASA: III  Anesthesia Plan: General   Post-op Pain Management:    Induction:   Airway Management Planned:   Additional Equipment:   Intra-op Plan:   Post-operative Plan:   Informed Consent: I have reviewed the  patients History and Physical, chart, labs and discussed the procedure including the risks, benefits and alternatives for the proposed anesthesia with the patient or authorized representative who has indicated his/her  understanding and acceptance.   Dental Advisory Given  Plan Discussed with: Anesthesiologist, CRNA and Surgeon  Anesthesia Plan Comments:         Anesthesia Quick Evaluation

## 2015-05-07 NOTE — H&P (Signed)
Primary Care Physician:  [facility] Primary Gastroenterologist:  Dr. Candace Cruise  Pre-Procedure History & Physical: HPI:  Whitney Banks is a 65 y.o. female is here for an EGD/colonoscopy}.   Past Medical History  Diagnosis Date  . HTN (hypertension)   . Obesity, Class III, 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y) ([place])   . DM (diabetes mellitus) ([place])   . Anemia   . Gallstones     Past Surgical History  Procedure Laterality Date  . Partial hysterectomy    . Gallstones      Prior to Admission medications   Medication Sig Start Date End Date Taking? Authorizing Provider  lovastatin (MEVACOR) 20 MG tablet Take 20 mg by mouth at bedtime.   Yes Historical Provider, MD  aspirin 325 MG tablet Take 325 mg by mouth daily.    Historical Provider, MD  cetirizine (ZYRTEC) 10 MG tablet Take 10 mg by mouth daily.    Historical Provider, MD  citalopram (CELEXA) 20 MG tablet Take 20 mg by mouth daily.    Historical Provider, MD  docusate sodium (COLACE) 100 MG capsule Take 100 mg by mouth every other day.    Historical Provider, MD  estradiol (ESTRACE) 0.5 MG tablet Take 0.5 mg by mouth daily.    Historical Provider, MD  ferrous gluconate (FERGON) 325 MG tablet Take 325 mg by mouth daily with breakfast.    Historical Provider, MD  lisinopril-hydrochlorothiazide (PRINZIDE,ZESTORETIC) 20-12.5 MG per tablet Take 1 tablet by mouth 2 (two) times daily.    Historical Provider, MD  metFORMIN (GLUCOPHAGE) 500 MG tablet Take 500 mg by mouth daily with breakfast.     Historical Provider, MD  naproxen (NAPROSYN) 500 MG tablet Take 500 mg by mouth daily.  02/11/13   Historical Provider, MD  ranitidine (ZANTAC) 150 MG tablet Take 150 mg by mouth 2 (two) times daily.    Historical Provider, MD  zolpidem (AMBIEN) 10 MG tablet Take 10 mg by mouth at bedtime.  02/19/13   Historical Provider, MD    Allergies as of 04/27/2015 - Review Complete 09/14/2014  Allergen Reaction Noted  . Penicillins Hives  10/07/2012    Family History  Problem Relation Age of Onset  . Heart attack Father   . Breast cancer Sister 68    Social History   Social History  . Marital Status: Married    Spouse Name: N/A  . Number of Children: N/A  . Years of Education: N/A   Occupational History  . Not on file.   Social History Main Topics  . Smoking status: Never Smoker   . Smokeless tobacco: Not on file  . Alcohol Use: No  . Drug Use: No  . Sexual Activity: Not on file   Other Topics Concern  . Not on file   Social History Narrative    Review of Systems: See HPI, otherwise negative ROS  Physical Exam: BP 148/76 mmHg  Pulse 72  Temp(Src) 98.4 F (36.9 C) (Tympanic)  Resp 20  Ht 5' (1.524 m)  Wt 103.42 kg (228 lb)  BMI 44.53 kg/m2  SpO2 99% General:   Alert,  pleasant and cooperative in NAD Head:  Normocephalic and atraumatic. Neck:  Supple; no masses or thyromegaly. Lungs:  Clear throughout to auscultation.    Heart:  Regular rate and rhythm. Abdomen:  Soft, nontender and nondistended. Normal bowel sounds, without guarding, and without rebound.   Neurologic:  Alert and  oriented x4;  grossly normal neurologically.  Impression/Plan: Whitney Banks is here for an EGD/colonoscopy to be performed for GERD, epigastric pain, IDA.  Risks, benefits, limitations, and alternatives regarding  EGD/colonoscopy have been reviewed with the patient.  Questions have been answered.  All parties agreeable.   [name], Lupita Dawn, MD  05/07/2015, 8:31 AM

## 2015-05-07 NOTE — Op Note (Signed)
[facility] Gastroenterology Patient Name: Whitney Banks Procedure Date: 05/07/2015 8:30 AM MRN: [id number] Account #: [id number] Date of Birth: 1951-01-12 Admit Type: Outpatient Age: 65 Room: [facility] ENDO ROOM 4 Gender: Female Note Status: Finalized Procedure:            Colonoscopy Indications:          Iron deficiency anemia Providers:            Lupita Dawn. Candace Cruise, MD Referring MD:         Marguerita Merles, MD (Referring MD) Medicines:            Monitored Anesthesia Care Complications:        No immediate complications. Procedure:            Pre-Anesthesia Assessment:                       - Prior to the procedure, a History and Physical was                        performed, and patient medications, allergies and                        sensitivities were reviewed. The patient's tolerance of                        previous anesthesia was reviewed.                       - The risks and benefits of the procedure and the                        sedation options and risks were discussed with the                        patient. All questions were answered and informed                        consent was obtained.                       - After reviewing the risks and benefits, the patient                        was deemed in satisfactory condition to undergo the                        procedure.                       After obtaining informed consent, the colonoscope was                        passed under direct vision. Throughout the procedure,                        the patient's blood pressure, pulse, and oxygen                        saturations were monitored continuously. The Olympus  CF-H180AL colonoscope ( S#: [id number] ) was introduced                        through the anus and advanced to the the cecum,                        identified by appendiceal orifice and ileocecal valve.                        The colonoscopy was performed without  difficulty. The                        patient tolerated the procedure well. The quality of                        the bowel preparation was fair. Findings:      A diminutive polyp was found in the hepatic flexure. The polyp was       sessile. The polyp was removed with a jumbo cold forceps. Resection and       retrieval were complete.      The exam was otherwise without abnormality.      The perianal exam findings include non-thrombosed internal hemorrhoids.      The exam was otherwise without abnormality. Impression:           - Preparation of the colon was fair.                       - One diminutive polyp at the hepatic flexure, removed                        with a jumbo cold forceps. Resected and retrieved.                       - The examination was otherwise normal.                       - Non-thrombosed internal hemorrhoids found on perianal                        exam.                       - The examination was otherwise normal. Recommendation:       - Discharge patient to home.                       - Repeat colonoscopy in 5-10 years for surveillance                        based on pathology results.                       - The findings and recommendations were discussed with                        the patient. Procedure Code(s):    --- Professional ---                       [phone], Colonoscopy, flexible; with biopsy, single or  multiple Diagnosis Code(s):    --- Professional ---                       K64.8, Other hemorrhoids                       D12.3, Benign neoplasm of transverse colon (hepatic                        flexure or splenic flexure)                       D50.9, Iron deficiency anemia, unspecified CPT copyright 2016 American Medical Association. All rights reserved. The codes documented in this report are preliminary and upon coder review may  be revised to meet current compliance requirements. Hulen Luster, MD 05/07/2015 9:00:38 AM This  report has been signed electronically. Number of Addenda: 0 Note Initiated On: 05/07/2015 8:30 AM Scope Withdrawal Time: 0 hours 7 minutes 7 seconds  Total Procedure Duration: 0 hours 10 minutes 7 seconds       [facility]

## 2015-05-07 NOTE — Op Note (Signed)
[facility] Gastroenterology Patient Name: Whitney Banks Procedure Date: 05/07/2015 8:30 AM MRN: [id number] Account #: [id number] Date of Birth: 1950-12-15 Admit Type: Outpatient Age: 65 Room: [facility] ENDO ROOM 4 Gender: Female Note Status: Finalized Procedure:            Upper GI endoscopy Indications:          Epigastric abdominal pain, Iron deficiency anemia,                        Dysphagia, Suspected esophageal reflux Providers:            Lupita Dawn. Candace Cruise, MD Referring MD:         Marguerita Merles, MD (Referring MD) Medicines:            Monitored Anesthesia Care Complications:        No immediate complications. Procedure:            Pre-Anesthesia Assessment:                       - Prior to the procedure, a History and Physical was                        performed, and patient medications, allergies and                        sensitivities were reviewed. The patient's tolerance of                        previous anesthesia was reviewed.                       - The risks and benefits of the procedure and the                        sedation options and risks were discussed with the                        patient. All questions were answered and informed                        consent was obtained.                       - After reviewing the risks and benefits, the patient                        was deemed in satisfactory condition to undergo the                        procedure.                       After obtaining informed consent, the endoscope was                        passed under direct vision. Throughout the procedure,                        the patient's blood pressure, pulse, and oxygen  saturations were monitored continuously. The Endoscope                        was introduced through the mouth, and advanced to the                        second part of duodenum. The upper GI endoscopy was                        accomplished without  difficulty. The patient tolerated                        the procedure well. Findings:      One mild benign-appearing, intrinsic stenosis was found at the       gastroesophageal junction. And was traversed. The scope was withdrawn.       Dilation was performed with a Maloney dilator with mild resistance at 63       Fr.      The exam was otherwise without abnormality.      A small hiatal hernia was present.      Localized mild inflammation characterized by erythema was found in the       gastric antrum. Biopsies were taken with a cold forceps for Helicobacter       pylori testing.      The exam was otherwise without abnormality.      Localized mildly erythematous mucosa was found in the first portion of       the duodenum.      The exam was otherwise without abnormality. Impression:           - Benign-appearing esophageal stenosis. Dilated.                       - The examination was otherwise normal.                       - Small hiatal hernia.                       - Gastritis. Biopsied.                       - The examination was otherwise normal.                       - Erythematous duodenopathy.                       - The examination was otherwise normal. Recommendation:       - Discharge patient to home.                       - Observe patient's clinical course.                       - Await pathology results.                       - The findings and recommendations were discussed with                        the patient.                       -  Needs daily PPI Procedure Code(s):    --- Professional ---                       [phone], Esophagogastroduodenoscopy, flexible, transoral;                        with biopsy, single or multiple                       43450, Dilation of esophagus, by unguided sound or                        bougie, single or multiple passes Diagnosis Code(s):    --- Professional ---                       K22.2, Esophageal obstruction                        K44.9, Diaphragmatic hernia without obstruction or                        gangrene                       K29.70, Gastritis, unspecified, without bleeding                       K31.89, Other diseases of stomach and duodenum                       R10.13, Epigastric pain                       D50.9, Iron deficiency anemia, unspecified CPT copyright 2016 American Medical Association. All rights reserved. The codes documented in this report are preliminary and upon coder review may  be revised to meet current compliance requirements. Hulen Luster, MD 05/07/2015 8:46:40 AM This report has been signed electronically. Number of Addenda: 0 Note Initiated On: 05/07/2015 8:30 AM      [facility]

## 2015-05-07 NOTE — Anesthesia Procedure Notes (Signed)
Date/Time: 05/07/2015 8:39 AM Performed by: Kennon Holter Pre-anesthesia Checklist: Patient being monitored, Timeout performed, Suction available, Emergency Drugs available and Patient identified Patient Re-evaluated:Patient Re-evaluated prior to inductionOxygen Delivery Method: Nasal cannula Preoxygenation: Pre-oxygenation with 100% oxygen Intubation Type: IV induction

## 2015-05-07 NOTE — Anesthesia Postprocedure Evaluation (Signed)
Anesthesia Post Note  Patient: Whitney Banks  Procedure(s) Performed: Procedure(s) (LRB): ESOPHAGOGASTRODUODENOSCOPY (EGD) WITH PROPOFOL (N/A) COLONOSCOPY WITH PROPOFOL (N/A)  Patient location during evaluation: Endoscopy Anesthesia Type: General Level of consciousness: awake and alert Pain management: pain level controlled Vital Signs Assessment: post-procedure vital signs reviewed and stable Respiratory status: spontaneous breathing, nonlabored ventilation, respiratory function stable and patient connected to nasal cannula oxygen Cardiovascular status: blood pressure returned to baseline and stable Postop Assessment: no signs of nausea or vomiting Anesthetic complications: no    Last Vitals:  Filed Vitals:   05/07/15 0923 05/07/15 0933  BP: 113/65 115/56  Pulse: 59 62  Temp:    Resp: 15 15    Last Pain: There were no vitals filed for this visit.               Precious Haws Piscitello

## 2015-05-10 ENCOUNTER — Encounter: Payer: Self-pay | Admitting: Gastroenterology

## 2015-05-10 LAB — SURGICAL PATHOLOGY

## 2015-08-11 ENCOUNTER — Other Ambulatory Visit: Payer: Self-pay | Admitting: Family Medicine

## 2015-08-11 DIAGNOSIS — Z1231 Encounter for screening mammogram for malignant neoplasm of breast: Secondary | ICD-10-CM

## 2015-09-15 ENCOUNTER — Ambulatory Visit
Admission: RE | Admit: 2015-09-15 | Discharge: 2015-09-15 | Disposition: A | Discharge status: Home / Self Care | Payer: BLUE CROSS/BLUE SHIELD | Source: Ambulatory Visit | Attending: Family Medicine | Admitting: Family Medicine

## 2015-09-15 ENCOUNTER — Other Ambulatory Visit: Payer: Self-pay | Admitting: Family Medicine

## 2015-09-15 DIAGNOSIS — R928 Other abnormal and inconclusive findings on diagnostic imaging of breast: Secondary | ICD-10-CM | POA: Insufficient documentation

## 2015-09-15 DIAGNOSIS — Z1231 Encounter for screening mammogram for malignant neoplasm of breast: Secondary | ICD-10-CM

## 2015-09-20 ENCOUNTER — Other Ambulatory Visit: Payer: Self-pay | Admitting: Family Medicine

## 2015-09-20 DIAGNOSIS — N6310 Unspecified lump in the right breast, unspecified quadrant: Secondary | ICD-10-CM

## 2015-09-29 ENCOUNTER — Ambulatory Visit
Admission: RE | Admit: 2015-09-29 | Discharge: 2015-09-29 | Disposition: A | Discharge status: Home / Self Care | Payer: BLUE CROSS/BLUE SHIELD | Source: Ambulatory Visit | Attending: Family Medicine | Admitting: Family Medicine

## 2015-09-29 DIAGNOSIS — N6310 Unspecified lump in the right breast, unspecified quadrant: Secondary | ICD-10-CM

## 2015-09-29 DIAGNOSIS — N63 Unspecified lump in breast: Secondary | ICD-10-CM | POA: Diagnosis present

## 2015-12-17 DIAGNOSIS — H6993 Unspecified Eustachian tube disorder, bilateral: Secondary | ICD-10-CM | POA: Insufficient documentation

## 2016-06-05 DIAGNOSIS — R252 Cramp and spasm: Secondary | ICD-10-CM | POA: Insufficient documentation

## 2016-06-19 DIAGNOSIS — N183 Chronic kidney disease, stage 3 unspecified: Secondary | ICD-10-CM | POA: Insufficient documentation

## 2016-07-21 DIAGNOSIS — M25562 Pain in left knee: Secondary | ICD-10-CM | POA: Insufficient documentation

## 2016-09-11 ENCOUNTER — Other Ambulatory Visit: Payer: Self-pay | Admitting: Family Medicine

## 2016-09-11 DIAGNOSIS — Z1231 Encounter for screening mammogram for malignant neoplasm of breast: Secondary | ICD-10-CM

## 2016-09-25 ENCOUNTER — Ambulatory Visit
Admission: RE | Admit: 2016-09-25 | Discharge: 2016-09-25 | Disposition: A | Discharge status: Home / Self Care | Payer: BLUE CROSS/BLUE SHIELD | Source: Ambulatory Visit | Attending: Family Medicine | Admitting: Family Medicine

## 2016-09-25 ENCOUNTER — Other Ambulatory Visit: Payer: Self-pay | Admitting: Family Medicine

## 2016-09-25 DIAGNOSIS — Z1231 Encounter for screening mammogram for malignant neoplasm of breast: Secondary | ICD-10-CM | POA: Diagnosis present

## 2017-02-02 ENCOUNTER — Encounter: Payer: Self-pay | Admitting: Emergency Medicine

## 2017-02-02 ENCOUNTER — Other Ambulatory Visit: Payer: Self-pay

## 2017-02-02 ENCOUNTER — Emergency Department
Admission: EM | Admit: 2017-02-02 | Discharge: 2017-02-03 | Disposition: A | Discharge status: Home / Self Care | Payer: No Typology Code available for payment source | Attending: Student in an Organized Health Care Education/Training Program | Admitting: Student in an Organized Health Care Education/Training Program

## 2017-02-02 ENCOUNTER — Emergency Department: Payer: No Typology Code available for payment source

## 2017-02-02 DIAGNOSIS — M545 Low back pain: Secondary | ICD-10-CM | POA: Insufficient documentation

## 2017-02-02 DIAGNOSIS — Y939 Activity, unspecified: Secondary | ICD-10-CM | POA: Diagnosis not present

## 2017-02-02 DIAGNOSIS — Y92410 Unspecified street and highway as the place of occurrence of the external cause: Secondary | ICD-10-CM | POA: Insufficient documentation

## 2017-02-02 DIAGNOSIS — S199XXA Unspecified injury of neck, initial encounter: Secondary | ICD-10-CM | POA: Diagnosis present

## 2017-02-02 DIAGNOSIS — Y998 Other external cause status: Secondary | ICD-10-CM | POA: Insufficient documentation

## 2017-02-02 DIAGNOSIS — S161XXA Strain of muscle, fascia and tendon at neck level, initial encounter: Secondary | ICD-10-CM

## 2017-02-02 DIAGNOSIS — Z7984 Long term (current) use of oral hypoglycemic drugs: Secondary | ICD-10-CM | POA: Insufficient documentation

## 2017-02-02 DIAGNOSIS — I10 Essential (primary) hypertension: Secondary | ICD-10-CM | POA: Insufficient documentation

## 2017-02-02 DIAGNOSIS — Z79899 Other long term (current) drug therapy: Secondary | ICD-10-CM | POA: Insufficient documentation

## 2017-02-02 DIAGNOSIS — E119 Type 2 diabetes mellitus without complications: Secondary | ICD-10-CM | POA: Diagnosis not present

## 2017-02-02 DIAGNOSIS — Z7982 Long term (current) use of aspirin: Secondary | ICD-10-CM | POA: Insufficient documentation

## 2017-02-02 MED ORDER — CYCLOBENZAPRINE HCL 10 MG PO TABS: 10.0000 mg | ORAL_TABLET | Freq: Three times a day (TID) | ORAL | 0 refills | Status: DC | PRN

## 2017-02-02 MED ORDER — CYCLOBENZAPRINE HCL 10 MG PO TABS
5.0000 mg | ORAL_TABLET | Freq: Once | ORAL | Status: AC
  Administered 2017-02-02: 5 mg via ORAL
  Filled 2017-02-02: qty 1

## 2017-02-02 MED ORDER — NAPROXEN 250 MG PO TABS
375.0000 mg | ORAL_TABLET | Freq: Once | ORAL | Status: AC
  Administered 2017-02-02: 375 mg via ORAL
  Filled 2017-02-02: qty 2

## 2017-02-02 MED ORDER — HYDROCODONE-ACETAMINOPHEN 5-325 MG PO TABS
1.0000 | ORAL_TABLET | Freq: Once | ORAL | Status: AC
  Administered 2017-02-02: 1 via ORAL
  Filled 2017-02-02: qty 1

## 2017-02-02 MED ORDER — HYDROCODONE-ACETAMINOPHEN 5-325 MG PO TABS: 1.0000 | ORAL_TABLET | ORAL | 0 refills | Status: DC | PRN
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
## 2017-08-17 DIAGNOSIS — M79604 Pain in right leg: Secondary | ICD-10-CM | POA: Insufficient documentation

## 2017-08-24 ENCOUNTER — Other Ambulatory Visit: Payer: Self-pay | Admitting: Family Medicine

## 2017-08-24 DIAGNOSIS — Z1231 Encounter for screening mammogram for malignant neoplasm of breast: Secondary | ICD-10-CM

## 2017-09-27 ENCOUNTER — Ambulatory Visit
Admission: RE | Admit: 2017-09-27 | Discharge: 2017-09-27 | Disposition: A | Discharge status: Home / Self Care | Payer: BLUE CROSS/BLUE SHIELD | Source: Ambulatory Visit | Attending: Family Medicine | Admitting: Family Medicine

## 2017-09-27 DIAGNOSIS — Z1231 Encounter for screening mammogram for malignant neoplasm of breast: Secondary | ICD-10-CM | POA: Diagnosis not present

## 2017-12-28 HISTORY — PX: REVISION TOTAL KNEE ARTHROPLASTY: SHX767

## 2018-08-28 ENCOUNTER — Other Ambulatory Visit: Payer: Self-pay | Admitting: Family Medicine

## 2018-08-28 DIAGNOSIS — Z1231 Encounter for screening mammogram for malignant neoplasm of breast: Secondary | ICD-10-CM

## 2018-09-16 DIAGNOSIS — D649 Anemia, unspecified: Secondary | ICD-10-CM | POA: Insufficient documentation

## 2018-10-10 ENCOUNTER — Ambulatory Visit
Admission: RE | Admit: 2018-10-10 | Discharge: 2018-10-10 | Disposition: A | Discharge status: Home / Self Care | Payer: Medicare HMO | Source: Ambulatory Visit | Attending: Family Medicine | Admitting: Family Medicine

## 2018-10-10 DIAGNOSIS — Z1231 Encounter for screening mammogram for malignant neoplasm of breast: Secondary | ICD-10-CM | POA: Diagnosis not present

## 2018-11-04 ENCOUNTER — Other Ambulatory Visit: Payer: Self-pay | Admitting: Family Medicine

## 2018-11-04 DIAGNOSIS — Z13820 Encounter for screening for osteoporosis: Secondary | ICD-10-CM

## 2018-11-12 ENCOUNTER — Other Ambulatory Visit
Admission: RE | Admit: 2018-11-12 | Discharge: 2018-11-12 | Disposition: A | Discharge status: Home / Self Care | Payer: Medicare HMO | Source: Ambulatory Visit | Attending: Gastroenterology | Admitting: Gastroenterology

## 2018-11-12 ENCOUNTER — Other Ambulatory Visit: Payer: Self-pay

## 2018-11-12 DIAGNOSIS — Z20828 Contact with and (suspected) exposure to other viral communicable diseases: Secondary | ICD-10-CM | POA: Insufficient documentation

## 2018-11-12 DIAGNOSIS — Z01812 Encounter for preprocedural laboratory examination: Secondary | ICD-10-CM | POA: Insufficient documentation

## 2018-11-12 LAB — SARS CORONAVIRUS 2 (TAT 6-24 HRS): SARS Coronavirus 2: NEGATIVE

## 2018-11-13 ENCOUNTER — Encounter: Payer: Self-pay | Admitting: *Deleted

## 2018-11-14 ENCOUNTER — Other Ambulatory Visit: Payer: Self-pay

## 2018-11-14 ENCOUNTER — Ambulatory Visit: Payer: Medicare HMO | Admitting: Certified Registered"

## 2018-11-14 ENCOUNTER — Encounter
Admission: RE | Disposition: A | Discharge status: Home / Self Care | Payer: Self-pay | Source: Home / Self Care | Attending: Gastroenterology

## 2018-11-14 ENCOUNTER — Ambulatory Visit
Admission: RE | Admit: 2018-11-14 | Discharge: 2018-11-14 | Disposition: A | Discharge status: Home / Self Care | Payer: Medicare HMO | Attending: Gastroenterology | Admitting: Gastroenterology

## 2018-11-14 DIAGNOSIS — Z7984 Long term (current) use of oral hypoglycemic drugs: Secondary | ICD-10-CM | POA: Diagnosis not present

## 2018-11-14 DIAGNOSIS — K219 Gastro-esophageal reflux disease without esophagitis: Secondary | ICD-10-CM | POA: Diagnosis not present

## 2018-11-14 DIAGNOSIS — Z791 Long term (current) use of non-steroidal anti-inflammatories (NSAID): Secondary | ICD-10-CM | POA: Diagnosis not present

## 2018-11-14 DIAGNOSIS — I10 Essential (primary) hypertension: Secondary | ICD-10-CM | POA: Diagnosis not present

## 2018-11-14 DIAGNOSIS — K2970 Gastritis, unspecified, without bleeding: Secondary | ICD-10-CM | POA: Insufficient documentation

## 2018-11-14 DIAGNOSIS — Z86010 Personal history of colonic polyps: Secondary | ICD-10-CM | POA: Diagnosis not present

## 2018-11-14 DIAGNOSIS — Z8619 Personal history of other infectious and parasitic diseases: Secondary | ICD-10-CM | POA: Diagnosis not present

## 2018-11-14 DIAGNOSIS — Z1211 Encounter for screening for malignant neoplasm of colon: Secondary | ICD-10-CM | POA: Diagnosis not present

## 2018-11-14 DIAGNOSIS — D509 Iron deficiency anemia, unspecified: Secondary | ICD-10-CM | POA: Insufficient documentation

## 2018-11-14 DIAGNOSIS — Z79899 Other long term (current) drug therapy: Secondary | ICD-10-CM | POA: Insufficient documentation

## 2018-11-14 DIAGNOSIS — E119 Type 2 diabetes mellitus without complications: Secondary | ICD-10-CM | POA: Diagnosis not present

## 2018-11-14 DIAGNOSIS — K224 Dyskinesia of esophagus: Secondary | ICD-10-CM | POA: Diagnosis not present

## 2018-11-14 DIAGNOSIS — Z6841 Body Mass Index (BMI) 40.0 and over, adult: Secondary | ICD-10-CM | POA: Diagnosis not present

## 2018-11-14 DIAGNOSIS — K449 Diaphragmatic hernia without obstruction or gangrene: Secondary | ICD-10-CM | POA: Diagnosis not present

## 2018-11-14 DIAGNOSIS — Z90710 Acquired absence of both cervix and uterus: Secondary | ICD-10-CM | POA: Insufficient documentation

## 2018-11-14 DIAGNOSIS — Z7982 Long term (current) use of aspirin: Secondary | ICD-10-CM | POA: Diagnosis not present

## 2018-11-14 HISTORY — PX: ESOPHAGOGASTRODUODENOSCOPY (EGD) WITH PROPOFOL: SHX5813

## 2018-11-14 HISTORY — DX: Gastro-esophageal reflux disease without esophagitis: K21.9

## 2018-11-14 HISTORY — PX: COLONOSCOPY WITH PROPOFOL: SHX5780

## 2018-11-14 LAB — CBC WITH DIFFERENTIAL/PLATELET
Abs Immature Granulocytes: 0.03 10*3/uL (ref 0.00–0.07)
Basophils Absolute: 0.1 10*3/uL (ref 0.0–0.1)
Basophils Relative: 1 %
Eosinophils Absolute: 0.3 10*3/uL (ref 0.0–0.5)
Eosinophils Relative: 3 %
HCT: 29.7 % — ABNORMAL LOW (ref 36.0–46.0)
Hemoglobin: 8.9 g/dL — ABNORMAL LOW (ref 12.0–15.0)
Immature Granulocytes: 0 %
Lymphocytes Relative: 26 %
Lymphs Abs: 2.2 10*3/uL (ref 0.7–4.0)
MCH: 17.9 pg — ABNORMAL LOW (ref 26.0–34.0)
MCHC: 30 g/dL (ref 30.0–36.0)
MCV: 59.8 fL — ABNORMAL LOW (ref 80.0–100.0)
Monocytes Absolute: 0.5 10*3/uL (ref 0.1–1.0)
Monocytes Relative: 5 %
Neutro Abs: 5.6 10*3/uL (ref 1.7–7.7)
Neutrophils Relative %: 65 %
Platelets: 371 10*3/uL (ref 150–400)
RBC: 4.97 MIL/uL (ref 3.87–5.11)
RDW: 19.9 % — ABNORMAL HIGH (ref 11.5–15.5)
Smear Review: NORMAL
WBC: 8.8 10*3/uL (ref 4.0–10.5)
nRBC: 0 % (ref 0.0–0.2)

## 2018-11-14 LAB — GLUCOSE, CAPILLARY: Glucose-Capillary: 99 mg/dL (ref 70–99)

## 2018-11-14 SURGERY — ESOPHAGOGASTRODUODENOSCOPY (EGD) WITH PROPOFOL
Anesthesia: General

## 2018-11-14 MED ORDER — SODIUM CHLORIDE 0.9 % IV SOLN: INTRAVENOUS | Status: DC

## 2018-11-14 MED ORDER — PROPOFOL 500 MG/50ML IV EMUL
INTRAVENOUS | Status: AC
  Filled 2018-11-14: qty 50

## 2018-11-14 MED ORDER — GLYCOPYRROLATE 0.2 MG/ML IJ SOLN
INTRAMUSCULAR | Status: AC
  Filled 2018-11-14: qty 1

## 2018-11-14 MED ORDER — EPHEDRINE SULFATE 50 MG/ML IJ SOLN
INTRAMUSCULAR | Status: DC | PRN
  Administered 2018-11-14 (×2): 5 mg via INTRAVENOUS

## 2018-11-14 MED ORDER — FENTANYL CITRATE (PF) 100 MCG/2ML IJ SOLN
INTRAMUSCULAR | Status: AC
  Filled 2018-11-14: qty 2

## 2018-11-14 MED ORDER — EPHEDRINE SULFATE 50 MG/ML IJ SOLN
INTRAMUSCULAR | Status: AC
  Filled 2018-11-14: qty 1

## 2018-11-14 MED ORDER — EPINEPHRINE PF 1 MG/ML IJ SOLN
INTRAMUSCULAR | Status: DC | PRN
  Administered 2018-11-14: 1 mL

## 2018-11-14 MED ORDER — PROPOFOL 10 MG/ML IV BOLUS
INTRAVENOUS | Status: DC | PRN
  Administered 2018-11-14: 40 mg via INTRAVENOUS
  Administered 2018-11-14 (×2): 20 mg via INTRAVENOUS
  Administered 2018-11-14: 10 mg via INTRAVENOUS

## 2018-11-14 MED ORDER — SODIUM CHLORIDE 0.9 % IV SOLN
INTRAVENOUS | Status: DC
  Administered 2018-11-14: 1000 mL via INTRAVENOUS
  Administered 2018-11-14: 08:00:00 via INTRAVENOUS

## 2018-11-14 MED ORDER — PROPOFOL 500 MG/50ML IV EMUL
INTRAVENOUS | Status: DC | PRN
  Administered 2018-11-14: 180 ug/kg/min via INTRAVENOUS

## 2018-11-14 MED ORDER — PROPOFOL 10 MG/ML IV BOLUS
INTRAVENOUS | Status: AC
  Filled 2018-11-14: qty 20

## 2018-11-14 MED ORDER — PHENYLEPHRINE HCL (PRESSORS) 10 MG/ML IV SOLN
INTRAVENOUS | Status: DC | PRN
  Administered 2018-11-14 (×4): 100 ug via INTRAVENOUS

## 2018-11-14 MED ORDER — GLYCOPYRROLATE 0.2 MG/ML IJ SOLN
INTRAMUSCULAR | Status: DC | PRN
  Administered 2018-11-14: .2 mg via INTRAVENOUS

## 2018-11-14 MED ORDER — MIDAZOLAM HCL 2 MG/2ML IJ SOLN
INTRAMUSCULAR | Status: AC
  Filled 2018-11-14: qty 2

## 2018-11-14 MED ORDER — LIDOCAINE HCL (PF) 2 % IJ SOLN
INTRAMUSCULAR | Status: AC
  Filled 2018-11-14: qty 10

## 2018-11-14 MED ORDER — FENTANYL CITRATE (PF) 100 MCG/2ML IJ SOLN
INTRAMUSCULAR | Status: DC | PRN
  Administered 2018-11-14 (×2): 25 ug via INTRAVENOUS

## 2018-11-14 MED ORDER — MIDAZOLAM HCL 2 MG/2ML IJ SOLN
INTRAMUSCULAR | Status: DC | PRN
  Administered 2018-11-14: 0.5 mg via INTRAVENOUS

## 2018-11-14 NOTE — Op Note (Signed)
[facility] Gastroenterology Patient Name: Whitney Banks Procedure Date: 11/14/2018 7:28 AM MRN: [id number] Account #: [id number] Date of Birth: November 07, 1950 Admit Type: Outpatient Age: 68 Room: [facility] ENDO ROOM 3 Gender: Female Note Status: Finalized Procedure:            Upper GI endoscopy Indications:          Suspected upper gastrointestinal bleeding in patient                        with unexplained iron deficiency anemia Providers:            Lollie Sails, MD Medicines:            Monitored Anesthesia Care Complications:        No immediate complications. Procedure:            Pre-Anesthesia Assessment:                       - ASA Grade Assessment: III - A patient with severe                        systemic disease.                       After obtaining informed consent, the endoscope was                        passed under direct vision. Throughout the procedure,                        the patient's blood pressure, pulse, and oxygen                        saturations were monitored continuously. The Endoscope                        was introduced through the mouth, and advanced to the                        antrum of the stomach. The upper GI endoscopy was                        performed with moderate difficulty due to unusual                        anatomy and the patient's body habitus and inability to                        mailtain insufflation. . The patient tolerated the                        procedure well. Findings:      Abnormal motility was noted in the lower third of the esophagus. The       cricopharyngeus was normal. There are extra peristaltic waves in the       esophageal body. The distal esophagus/lower esophageal sphincter is open.      A medium-sized sliding hiatal hernia was found.      Diffuse minimal inflammation characterized by erythema was found in the       gastric body. A sm all amount of blood is  seen in the distal antrum  on       introduction of the scope. Biopsies were taken with a cold forceps for       histology and Helicobacter pylori testing.      The cardia and gastric fundus were normal on retroflexion otherwise.      The duodenal bulb is noted to have an abnormal appearance possibly       scaring from prior ulcer disease, versus diverticulum. A mass-like       lesion is noted in the secomd portion of the duodenum, atypical in       appearance, with oozing, I went past the lesion and saw no evidence of       bleeding beyond, with otherwise normal duodenal mucosa beyond. There is       a ductal opening noted in the apparent 3rd portion, however on the       opposite wall than expected. I came back to the mass which was irritated       from passage of the scope and oozing more. One cc of 1:10000 epi was       injected into the base of the lesion, and a cold biopsy was taken.       Lesion was observed until hemostasis was achieved.      The cardia and gastric fundus were normal on retroflexion otherwise. Impression:           - Abnormal esophageal motility.                       - Medium-sized sliding hiatal hernia.                       - Bile gastritis. Biopsied.                       - Atypical mass lesion second to third portion of the                        duoden, active oozing, biopsied. I did not remove this                        lesion due to uncertainty of ductal anatomy in the                        setting of abnormal anatomy. Recommendation:       - Await pathology results. Procedure Code(s):    --- Professional ---                       [phone], 61, Esophagogastroduodenoscopy, flexible,                        transoral; with biopsy, single or multiple Diagnosis Code(s):    --- Professional ---                       K22.4, Dyskinesia of esophagus                       K44.9, Diaphragmatic hernia without obstruction or                        gangrene  K29.60, Other  gastritis without bleeding                       D50.9, Iron deficiency anemia, unspecified CPT copyright 2019 American Medical Association. All rights reserved. The codes documented in this report are preliminary and upon coder review may  be revised to meet current compliance requirements. Lollie Sails, MD 11/14/2018 8:56:45 AM This report has been signed electronically. Number of Addenda: 0 Note Initiated On: 11/14/2018 7:28 AM      [facility]

## 2018-11-14 NOTE — Anesthesia Preprocedure Evaluation (Addendum)
Anesthesia Evaluation  Patient identified by MRN, date of birth, ID band Patient awake    Reviewed: Allergy & Precautions, H&P , NPO status , Patient's Chart, lab work & pertinent test results  Airway Mallampati: II  TM Distance: >3 FB     Dental  (+) Chipped   Pulmonary neg COPD,           Cardiovascular hypertension, (-) Past MI      Neuro/Psych negative neurological ROS  negative psych ROS   GI/Hepatic Neg liver ROS, GERD  Controlled,  Endo/Other  diabetesMorbid obesity  Renal/GU negative Renal ROS  negative genitourinary   Musculoskeletal   Abdominal   Peds  Hematology  (+) Blood dyscrasia, anemia ,   Anesthesia Other Findings Past Medical History: No date: Anemia No date: DM (diabetes mellitus) (HCC) No date: Gallstones No date: GERD (gastroesophageal reflux disease) No date: HTN (hypertension) No date: Obesity, Class III, 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y) ([place])  Past Surgical History: No date: ABDOMINAL HYSTERECTOMY 05/07/2015: COLONOSCOPY WITH PROPOFOL; N/A     Comment:  Procedure: COLONOSCOPY WITH PROPOFOL;  Surgeon: Hulen Luster, MD;  Location: [facility] ENDOSCOPY;  Service:               Gastroenterology;  Laterality: N/A; 05/07/2015: ESOPHAGOGASTRODUODENOSCOPY (EGD) WITH PROPOFOL; N/A     Comment:  Procedure: ESOPHAGOGASTRODUODENOSCOPY (EGD) WITH               PROPOFOL;  Surgeon: Hulen Luster, MD;  Location: [facility]               ENDOSCOPY;  Service: Gastroenterology;  Laterality: N/A; No date: gallstones No date: JOINT REPLACEMENT     Comment:  RTK No date: PARTIAL HYSTERECTOMY     Reproductive/Obstetrics negative OB ROS                            Anesthesia Physical Anesthesia Plan  ASA: III  Anesthesia Plan: General   Post-op Pain Management:    Induction:   PONV Risk Score and Plan: Propofol infusion and TIVA  Airway Management Planned: Natural Airway and Nasal  Cannula  Additional Equipment:   Intra-op Plan:   Post-operative Plan:   Informed Consent: I have reviewed the patients History and Physical, chart, labs and discussed the procedure including the risks, benefits and alternatives for the proposed anesthesia with the patient or authorized representative who has indicated his/her understanding and acceptance.     Dental Advisory Given  Plan Discussed with: Anesthesiologist and CRNA  Anesthesia Plan Comments:         Anesthesia Quick Evaluation

## 2018-11-14 NOTE — Anesthesia Post-op Follow-up Note (Signed)
Anesthesia QCDR form completed.        

## 2018-11-14 NOTE — Anesthesia Postprocedure Evaluation (Signed)
Anesthesia Post Note  Patient: Whitney Banks  Procedure(s) Performed: ESOPHAGOGASTRODUODENOSCOPY (EGD) WITH PROPOFOL (N/A ) COLONOSCOPY WITH PROPOFOL (N/A )  Patient location during evaluation: PACU Anesthesia Type: General Level of consciousness: awake and alert Pain management: pain level controlled Vital Signs Assessment: post-procedure vital signs reviewed and stable Respiratory status: spontaneous breathing, nonlabored ventilation and respiratory function stable Cardiovascular status: blood pressure returned to baseline and stable Postop Assessment: no apparent nausea or vomiting Anesthetic complications: no     Last Vitals:  Vitals:   11/14/18 0933 11/14/18 1002  BP: 123/72 126/79  Pulse:    Resp:    Temp:    SpO2:      Last Pain:  Vitals:   11/14/18 1033  TempSrc:   PainSc: [place]

## 2018-11-14 NOTE — Anesthesia Procedure Notes (Signed)
Date/Time: 11/14/2018 7:40 AM Performed by: Allean Found, CRNA Pre-anesthesia Checklist: Patient identified, Emergency Drugs available, Suction available, Patient being monitored and Timeout performed Oxygen Delivery Method: Nasal cannula Placement Confirmation: positive ETCO2

## 2018-11-14 NOTE — Op Note (Signed)
[facility] Gastroenterology Patient Name: Whitney Banks Procedure Date: 11/14/2018 7:28 AM MRN: [id number] Account #: [id number] Date of Birth: 01-19-1951 Admit Type: Outpatient Age: 68 Room: [facility] ENDO ROOM 3 Gender: Female Note Status: Finalized Procedure:            Colonoscopy Indications:          Personal history of colonic polyps Providers:            Lollie Sails, MD Medicines:            Monitored Anesthesia Care Complications:        No immediate complications. Procedure:            Pre-Anesthesia Assessment:                       - ASA Grade Assessment: III - A patient with severe                        systemic disease.                       After obtaining informed consent, the colonoscope was                        passed under direct vision. Throughout the procedure,                        the patient's blood pressure, pulse, and oxygen                        saturations were monitored continuously. The                        Colonoscope was introduced through the anus and                        advanced to the the cecum, identified by appendiceal                        orifice and ileocecal valve. The colonoscopy was                        performed without difficulty. Findings:      The colon (entire examined portion) appeared normal.      The digital rectal exam was normal. I was unable to retroflex in the       rectum, however multiple radial passes were normal. Impression:           - The entire examined colon is normal.                       - No specimens collected. Recommendation:       - Discharge patient to home. Procedure Code(s):    --- Professional ---                       [phone], Colonoscopy, flexible; diagnostic, including                        collection of specimen(s) by brushing or washing, when  performed (separate procedure) Diagnosis Code(s):    --- Professional ---  Z86.010, Personal history of colonic polyps CPT copyright 2019 American Medical Association. All rights reserved. The codes documented in this report are preliminary and upon coder review may  be revised to meet current compliance requirements. Lollie Sails, MD 11/14/2018 9:18:38 AM This report has been signed electronically. Number of Addenda: 0 Note Initiated On: 11/14/2018 7:28 AM Scope Withdrawal Time: 0 hours 4 minutes 52 seconds  Total Procedure Duration: 0 hours 14 minutes 43 seconds       [facility]

## 2018-11-14 NOTE — Transfer of Care (Signed)
Immediate Anesthesia Transfer of Care Note  Patient: Whitney Banks  Procedure(s) Performed: ESOPHAGOGASTRODUODENOSCOPY (EGD) WITH PROPOFOL (N/A ) COLONOSCOPY WITH PROPOFOL (N/A )  Patient Location: PACU  Anesthesia Type:General  Level of Consciousness: awake  Airway & Oxygen Therapy: Patient Spontanous Breathing and Patient connected to nasal cannula oxygen  Post-op Assessment: Report given to RN and Post -op Vital signs reviewed and stable  Post vital signs: Reviewed and stable  Last Vitals:  Vitals Value Taken Time  BP    Temp    Pulse 84 11/14/18 0926  Resp 19 11/14/18 0926  SpO2 99 % 11/14/18 0926  Vitals shown include unvalidated device data.  Last Pain:  Vitals:   11/14/18 0923  TempSrc: Tympanic  PainSc:          Complications: No apparent anesthesia complications

## 2018-11-14 NOTE — H&P (Signed)
Outpatient short stay form Pre-procedure 11/14/2018 7:26 AM Whitney Sails MD  Primary Physician: Dr. Delight Stare  Reason for visit: EGD and colonoscopy  History of present illness: Patient is a 68 year old female presenting today for an EGD and colonoscopy.  Personal history of iron deficiency anemia and has been found to have a Hemoccult positive stool.  Last EGD and colonoscopy was 05/07/2015 with a finding of a colonic tubular adenoma as well as some gastritis.  She has a microcytic anemia.  Of note there is a past history of Helicobacter pylori infection as well.  She tolerated her prep well.  She takes a 81 mg aspirin daily however does not take any other aspirin product or blood thinning agent.  Patient takes a proton pump inhibitor daily for heartburn symptoms.  This has been effective for her.  She has no dysphagia.    Current Facility-Administered Medications:  .  0.9 %  sodium chloride infusion, , Intravenous, Continuous, Whitney Sails, MD, Last Rate: 20 mL/hr at 11/14/18 0718, 1,000 mL at 11/14/18 [id number]  Medications Prior to Admission  Medication Sig Dispense Refill Last Dose  . ascorbic acid (VITAMIN C) 500 MG tablet Take 500 mg by mouth daily.   Past Week at Unknown time  . aspirin 81 MG EC tablet Take 81 mg by mouth daily.    Past Week at Unknown time  . cetirizine (ZYRTEC) 10 MG tablet Take 10 mg by mouth daily.   Past Week at Unknown time  . Cyanocobalamin (VITAMIN B-12) 2500 MCG SUBL Take 1 tablet by mouth daily.   Past Week at Unknown time  . estradiol (ESTRACE) 0.5 MG tablet Take 0.5 mg by mouth daily.   Past Week at Unknown time  . ferrous gluconate (FERGON) 325 MG tablet Take 325 mg by mouth daily with breakfast.   Past Week at Unknown time  . lisinopril-hydrochlorothiazide (PRINZIDE,ZESTORETIC) 20-12.5 MG per tablet Take 1 tablet by mouth 2 (two) times daily.   Past Week at Unknown time  . losartan (COZAAR) 100 MG tablet Take 100 mg by mouth daily.   11/14/2018 at  0525  . lovastatin (MEVACOR) 20 MG tablet Take 20 mg by mouth at bedtime.   Past Week at Unknown time  . metFORMIN (GLUCOPHAGE) 500 MG tablet Take 500 mg by mouth daily with breakfast.    Past Week at Unknown time  . naproxen (NAPROSYN) 500 MG tablet Take 500 mg by mouth daily.    Past Week at Unknown time  . pantoprazole (PROTONIX) 40 MG tablet Take 40 mg by mouth daily.   Past Week at Unknown time  . Potassium 99 MG TABS Take 1 tablet by mouth daily.   Past Week at Unknown time  . zinc gluconate 50 MG tablet Take 50 mg by mouth daily.   Past Week at Unknown time  . zolpidem (AMBIEN) 10 MG tablet Take 10 mg by mouth at bedtime.    Past Week at Unknown time     Allergies  Allergen Reactions  . Penicillins Hives     Past Medical History:  Diagnosis Date  . Anemia   . DM (diabetes mellitus) ([place])   . Gallstones   . GERD (gastroesophageal reflux disease)   . HTN (hypertension)   . Obesity, Class III, 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y) ([place])     Review of systems:      Physical Exam    Heart and lungs: Regular rate and rhythm without rub or gallop lungs are bilaterally  clear    HEENT: Normocephalic atraumatic eyes are anicteric    Other:    Pertinant exam for procedure: Soft nontender nondistended bowel sounds positive normoactive    Planned proceedures: EGD, colonoscopy and indicated procedures. I have discussed the risks benefits and complications of procedures to include not limited to bleeding, infection, perforation and the risk of sedation and the patient wishes to proceed.   Whitney Sails, MD Gastroenterology 11/14/2018  7:26 AM

## 2018-11-15 ENCOUNTER — Encounter: Payer: Self-pay | Admitting: Gastroenterology

## 2018-11-15 LAB — SURGICAL PATHOLOGY

## 2018-11-18 ENCOUNTER — Ambulatory Visit
Admission: RE | Admit: 2018-11-18 | Discharge: 2018-11-18 | Disposition: A | Discharge status: Home / Self Care | Payer: Medicare HMO | Source: Ambulatory Visit | Attending: Family Medicine | Admitting: Family Medicine

## 2018-11-18 DIAGNOSIS — Z13820 Encounter for screening for osteoporosis: Secondary | ICD-10-CM

## 2018-11-18 DIAGNOSIS — Z780 Asymptomatic menopausal state: Secondary | ICD-10-CM | POA: Diagnosis not present

## 2018-11-18 DIAGNOSIS — E119 Type 2 diabetes mellitus without complications: Secondary | ICD-10-CM | POA: Diagnosis not present

## 2018-11-28 ENCOUNTER — Telehealth: Payer: Self-pay

## 2018-11-28 NOTE — Telephone Encounter (Signed)
EMR was performed by Dr. 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 Will close referral for EUS.

## 2018-11-28 NOTE — Telephone Encounter (Signed)
Received referral for EUS. Sent to Dr. Francella Solian to review.

## 2018-12-17 ENCOUNTER — Other Ambulatory Visit: Payer: Self-pay

## 2018-12-17 DIAGNOSIS — Z20822 Contact with and (suspected) exposure to covid-19: Secondary | ICD-10-CM

## 2018-12-18 LAB — NOVEL CORONAVIRUS, NAA: SARS-CoV-2, NAA: DETECTED — AB

## 2019-02-04 ENCOUNTER — Other Ambulatory Visit: Payer: Self-pay

## 2019-02-04 DIAGNOSIS — Z20822 Contact with and (suspected) exposure to covid-19: Secondary | ICD-10-CM

## 2019-02-06 LAB — NOVEL CORONAVIRUS, NAA: SARS-CoV-2, NAA: NOT DETECTED

## 2019-09-09 ENCOUNTER — Other Ambulatory Visit: Payer: Self-pay | Admitting: Family Medicine

## 2019-09-09 DIAGNOSIS — Z1231 Encounter for screening mammogram for malignant neoplasm of breast: Secondary | ICD-10-CM

## 2019-09-24 ENCOUNTER — Other Ambulatory Visit: Payer: Self-pay | Admitting: Physician Assistant

## 2019-09-24 DIAGNOSIS — IMO0001 Reserved for inherently not codable concepts without codable children: Secondary | ICD-10-CM

## 2019-10-09 ENCOUNTER — Ambulatory Visit
Admission: RE | Admit: 2019-10-09 | Discharge: 2019-10-09 | Disposition: A | Discharge status: Home / Self Care | Payer: Medicare HMO | Source: Ambulatory Visit | Attending: Physician Assistant | Admitting: Physician Assistant

## 2019-10-09 ENCOUNTER — Other Ambulatory Visit: Payer: Self-pay

## 2019-10-09 DIAGNOSIS — H9041 Sensorineural hearing loss, unilateral, right ear, with unrestricted hearing on the contralateral side: Secondary | ICD-10-CM | POA: Insufficient documentation

## 2019-10-09 DIAGNOSIS — IMO0001 Reserved for inherently not codable concepts without codable children: Secondary | ICD-10-CM

## 2019-10-09 MED ORDER — GADOBUTROL 1 MMOL/ML IV SOLN
10.0000 mL | Freq: Once | INTRAVENOUS | Status: AC | PRN
  Administered 2019-10-09: 10 mL via INTRAVENOUS

## 2019-10-13 ENCOUNTER — Other Ambulatory Visit: Payer: Self-pay

## 2019-10-13 ENCOUNTER — Ambulatory Visit
Admission: RE | Admit: 2019-10-13 | Discharge: 2019-10-13 | Disposition: A | Discharge status: Home / Self Care | Payer: Medicare HMO | Source: Ambulatory Visit | Attending: Family Medicine | Admitting: Family Medicine

## 2019-10-13 DIAGNOSIS — Z1231 Encounter for screening mammogram for malignant neoplasm of breast: Secondary | ICD-10-CM | POA: Insufficient documentation

## 2019-10-20 DIAGNOSIS — I6782 Cerebral ischemia: Secondary | ICD-10-CM | POA: Insufficient documentation

## 2019-10-24 ENCOUNTER — Other Ambulatory Visit: Payer: Self-pay | Admitting: Family Medicine

## 2019-10-24 DIAGNOSIS — I6782 Cerebral ischemia: Secondary | ICD-10-CM

## 2019-10-29 ENCOUNTER — Ambulatory Visit
Admission: RE | Admit: 2019-10-29 | Discharge: 2019-10-29 | Disposition: A | Discharge status: Home / Self Care | Payer: Medicare HMO | Source: Ambulatory Visit | Attending: Family Medicine | Admitting: Family Medicine

## 2019-10-29 ENCOUNTER — Other Ambulatory Visit: Payer: Self-pay

## 2019-10-29 DIAGNOSIS — I6782 Cerebral ischemia: Secondary | ICD-10-CM

## 2019-11-26 ENCOUNTER — Encounter: Payer: Self-pay | Admitting: Ophthalmology

## 2019-11-26 ENCOUNTER — Other Ambulatory Visit: Payer: Self-pay

## 2019-12-02 ENCOUNTER — Other Ambulatory Visit: Payer: Self-pay

## 2019-12-02 ENCOUNTER — Other Ambulatory Visit
Admission: RE | Admit: 2019-12-02 | Discharge: 2019-12-02 | Disposition: A | Discharge status: Home / Self Care | Payer: Medicare HMO | Source: Ambulatory Visit | Attending: Ophthalmology | Admitting: Ophthalmology

## 2019-12-02 DIAGNOSIS — Z01812 Encounter for preprocedural laboratory examination: Secondary | ICD-10-CM | POA: Insufficient documentation

## 2019-12-02 DIAGNOSIS — Z20822 Contact with and (suspected) exposure to covid-19: Secondary | ICD-10-CM | POA: Insufficient documentation

## 2019-12-02 LAB — SARS CORONAVIRUS 2 (TAT 6-24 HRS): SARS Coronavirus 2: NEGATIVE

## 2019-12-02 NOTE — Discharge Instructions (Signed)

## 2019-12-04 ENCOUNTER — Other Ambulatory Visit: Payer: Self-pay

## 2019-12-04 ENCOUNTER — Ambulatory Visit
Admission: RE | Admit: 2019-12-04 | Discharge: 2019-12-04 | Disposition: A | Discharge status: Home / Self Care | Payer: Medicare HMO | Attending: Ophthalmology | Admitting: Ophthalmology

## 2019-12-04 ENCOUNTER — Ambulatory Visit: Payer: Medicare HMO | Admitting: Anesthesiology

## 2019-12-04 ENCOUNTER — Encounter: Payer: Self-pay | Admitting: Ophthalmology

## 2019-12-04 ENCOUNTER — Encounter
Admission: RE | Disposition: A | Discharge status: Home / Self Care | Payer: Self-pay | Source: Home / Self Care | Attending: Ophthalmology

## 2019-12-04 DIAGNOSIS — Z6841 Body Mass Index (BMI) 40.0 and over, adult: Secondary | ICD-10-CM | POA: Diagnosis not present

## 2019-12-04 DIAGNOSIS — I10 Essential (primary) hypertension: Secondary | ICD-10-CM | POA: Insufficient documentation

## 2019-12-04 DIAGNOSIS — H2512 Age-related nuclear cataract, left eye: Secondary | ICD-10-CM | POA: Diagnosis present

## 2019-12-04 DIAGNOSIS — D649 Anemia, unspecified: Secondary | ICD-10-CM | POA: Insufficient documentation

## 2019-12-04 DIAGNOSIS — Z96651 Presence of right artificial knee joint: Secondary | ICD-10-CM | POA: Insufficient documentation

## 2019-12-04 DIAGNOSIS — R0602 Shortness of breath: Secondary | ICD-10-CM | POA: Diagnosis not present

## 2019-12-04 DIAGNOSIS — Z880 Allergy status to penicillin: Secondary | ICD-10-CM | POA: Diagnosis not present

## 2019-12-04 DIAGNOSIS — E1136 Type 2 diabetes mellitus with diabetic cataract: Secondary | ICD-10-CM | POA: Insufficient documentation

## 2019-12-04 DIAGNOSIS — Z8673 Personal history of transient ischemic attack (TIA), and cerebral infarction without residual deficits: Secondary | ICD-10-CM | POA: Insufficient documentation

## 2019-12-04 DIAGNOSIS — Z90710 Acquired absence of both cervix and uterus: Secondary | ICD-10-CM | POA: Insufficient documentation

## 2019-12-04 DIAGNOSIS — K219 Gastro-esophageal reflux disease without esophagitis: Secondary | ICD-10-CM | POA: Insufficient documentation

## 2019-12-04 HISTORY — DX: Nausea with vomiting, unspecified: R11.2

## 2019-12-04 HISTORY — DX: Unspecified osteoarthritis, unspecified site: M19.90

## 2019-12-04 HISTORY — DX: Unspecified hearing loss, right ear: H91.91

## 2019-12-04 HISTORY — DX: Presence of external hearing-aid: Z97.4

## 2019-12-04 HISTORY — PX: CATARACT EXTRACTION W/PHACO: SHX586

## 2019-12-04 HISTORY — DX: Other complications of anesthesia, initial encounter: T88.59XA

## 2019-12-04 HISTORY — DX: Cerebral infarction, unspecified: I63.9

## 2019-12-04 HISTORY — DX: Other specified postprocedural states: Z98.890

## 2019-12-04 HISTORY — DX: Presence of dental prosthetic device (complete) (partial): Z97.2

## 2019-12-04 LAB — GLUCOSE, CAPILLARY
Glucose-Capillary: 87 mg/dL (ref 70–99)
Glucose-Capillary: 99 mg/dL (ref 70–99)

## 2019-12-04 SURGERY — PHACOEMULSIFICATION, CATARACT, WITH IOL INSERTION
Anesthesia: Monitor Anesthesia Care | Site: Eye | Laterality: Left

## 2019-12-04 MED ORDER — ARMC OPHTHALMIC DILATING DROPS
1.0000 "application " | OPHTHALMIC | Status: DC | PRN
  Administered 2019-12-04 (×3): 1 via OPHTHALMIC

## 2019-12-04 MED ORDER — TETRACAINE HCL 0.5 % OP SOLN
1.0000 [drp] | OPHTHALMIC | Status: DC | PRN
  Administered 2019-12-04 (×3): 1 [drp] via OPHTHALMIC

## 2019-12-04 MED ORDER — ACETAMINOPHEN 160 MG/5ML PO SOLN: 325.0000 mg | Freq: Once | ORAL | Status: DC

## 2019-12-04 MED ORDER — ACETAMINOPHEN 325 MG PO TABS: 325.0000 mg | ORAL_TABLET | Freq: Once | ORAL | Status: DC

## 2019-12-04 MED ORDER — MOXIFLOXACIN HCL 0.5 % OP SOLN
OPHTHALMIC | Status: DC | PRN
  Administered 2019-12-04: 0.2 mL via OPHTHALMIC

## 2019-12-04 MED ORDER — EPINEPHRINE PF 1 MG/ML IJ SOLN
INTRAOCULAR | Status: DC | PRN
  Administered 2019-12-04: 64 mL via OPHTHALMIC

## 2019-12-04 MED ORDER — LIDOCAINE HCL (PF) 2 % IJ SOLN
INTRAOCULAR | Status: DC | PRN
  Administered 2019-12-04: 1 mL via INTRAOCULAR

## 2019-12-04 MED ORDER — PROVISC 10 MG/ML IO SOLN
INTRAOCULAR | Status: DC | PRN
  Administered 2019-12-04: 0.55 mL via INTRAOCULAR

## 2019-12-04 MED ORDER — MIDAZOLAM HCL 2 MG/2ML IJ SOLN
INTRAMUSCULAR | Status: DC | PRN
  Administered 2019-12-04: 1 mg via INTRAVENOUS

## 2019-12-04 MED ORDER — NA CHONDROIT SULF-NA HYALURON 40-17 MG/ML IO SOLN
INTRAOCULAR | Status: DC | PRN
  Administered 2019-12-04: 1 mL via INTRAOCULAR

## 2019-12-04 MED ORDER — TETRACAINE 0.5 % OP SOLN OPTIME - NO CHARGE
OPHTHALMIC | Status: DC | PRN
  Administered 2019-12-04: 2 [drp] via OPHTHALMIC

## 2019-12-04 MED ORDER — TRYPAN BLUE 0.06 % OP SOLN
OPHTHALMIC | Status: DC | PRN
  Administered 2019-12-04: 0.5 mL via INTRAOCULAR

## 2019-12-04 MED ORDER — BRIMONIDINE TARTRATE-TIMOLOL 0.2-0.5 % OP SOLN
OPHTHALMIC | Status: DC | PRN
  Administered 2019-12-04: 1 [drp] via OPHTHALMIC

## 2019-12-04 MED ORDER — LACTATED RINGERS IV SOLN: INTRAVENOUS | Status: DC

## 2019-12-04 SURGICAL SUPPLY — 20 items
DISSECTOR HYDRO NUCLEUS 50X22 (MISCELLANEOUS) ×12 IMPLANT
DRSG TEGADERM 2-3/8X2-3/4 SM (GAUZE/BANDAGES/DRESSINGS) ×3 IMPLANT
GLOVE BIOGEL PI IND STRL 8 (GLOVE) ×1 IMPLANT
GLOVE BIOGEL PI INDICATOR 8 (GLOVE) ×2
GOWN STRL REUS W/ TWL LRG LVL3 (GOWN DISPOSABLE) ×1 IMPLANT
GOWN STRL REUS W/ TWL XL LVL3 (GOWN DISPOSABLE) ×1 IMPLANT
GOWN STRL REUS W/TWL LRG LVL3 (GOWN DISPOSABLE) ×3
GOWN STRL REUS W/TWL XL LVL3 (GOWN DISPOSABLE) ×3
KNIFE 45D UP 2.3 (MISCELLANEOUS) ×3 IMPLANT
LENS IOL DIOP 19.5 (Intraocular Lens) ×3 IMPLANT
LENS IOL TECNIS MONO 19.5 (Intraocular Lens) IMPLANT
MARKER SKIN DUAL TIP RULER LAB (MISCELLANEOUS) ×3 IMPLANT
NDL CAPSULORHEX 25GA (NEEDLE) ×1 IMPLANT
NEEDLE CAPSULORHEX 25GA (NEEDLE) ×3 IMPLANT
PACK CATARACT (MISCELLANEOUS) ×3 IMPLANT
PACK DR. KING ARMS (PACKS) ×3 IMPLANT
PACK EYE AFTER SURG (MISCELLANEOUS) ×3 IMPLANT
SOLUTION OPHTHALMIC SALT (MISCELLANEOUS) ×3 IMPLANT
WATER STERILE IRR 250ML POUR (IV SOLUTION) ×3 IMPLANT
WIPE NON LINTING 3.25X3.25 (MISCELLANEOUS) ×3 IMPLANT

## 2019-12-04 NOTE — Anesthesia Postprocedure Evaluation (Signed)
Anesthesia Post Note  Patient: Whitney Banks  Procedure(s) Performed: CATARACT EXTRACTION PHACO AND INTRAOCULAR LENS PLACEMENT (IOC) LEFT DIABETIC VISION  3.40 00:33.8   (Left Eye)     Patient location during evaluation: PACU Anesthesia Type: MAC Level of consciousness: awake and alert and oriented Pain management: satisfactory to patient Vital Signs Assessment: post-procedure vital signs reviewed and stable Respiratory status: spontaneous breathing, nonlabored ventilation and respiratory function stable Cardiovascular status: blood pressure returned to baseline and stable Postop Assessment: Adequate PO intake and No signs of nausea or vomiting Anesthetic complications: no   No complications documented.  Raliegh Ip

## 2019-12-04 NOTE — Transfer of Care (Signed)
Immediate Anesthesia Transfer of Care Note  Patient: Whitney Banks  Procedure(s) Performed: CATARACT EXTRACTION PHACO AND INTRAOCULAR LENS PLACEMENT (IOC) LEFT DIABETIC VISION  3.40 00:33.8   (Left Eye)  Patient Location: PACU  Anesthesia Type: MAC  Level of Consciousness: awake, alert  and patient cooperative  Airway and Oxygen Therapy: Patient Spontanous Breathing and Patient connected to supplemental oxygen  Post-op Assessment: Post-op Vital signs reviewed, Patient's Cardiovascular Status Stable, Respiratory Function Stable, Patent Airway and No signs of Nausea or vomiting  Post-op Vital Signs: Reviewed and stable  Complications: No complications documented.

## 2019-12-04 NOTE — Anesthesia Procedure Notes (Signed)
Date/Time: 12/04/2019 7:36 AM Performed by: Dionne Bucy, CRNA Oxygen Delivery Method: Nasal cannula Placement Confirmation: CO2 detector

## 2019-12-04 NOTE — H&P (Signed)
   I have reviewed the patient's H&P and agree with its findings. There have been no interval changes.  Nelta Caudill MD Ophthalmology 

## 2019-12-04 NOTE — Op Note (Signed)
  PREOPERATIVE DIAGNOSIS:  Nuclear sclerotic cataract of the LEFT eye.   POSTOPERATIVE DIAGNOSIS:  Nuclear sclerotic cataract of the LEFT eye.   OPERATIVE PROCEDURE: Cataract surgery OS   SURGEON:  Marchia Meiers, MD.   ANESTHESIA:  Anesthesiologist: Ronelle Nigh, MD CRNA: Dionne Bucy, CRNA  1.      Managed anesthesia care. 2.     0.7ml of Shugarcaine was instilled following the paracentesis   COMPLICATIONS:  None.   TECHNIQUE:   Divide and conquer   DESCRIPTION OF PROCEDURE:  The patient was examined and consented in the preoperative holding area where the aforementioned topical anesthesia was applied to the LEFT eye and then brought back to the Operating Room where the left eye was prepped and draped in the usual sterile ophthalmic fashion and a lid speculum was placed. A paracentesis was created with the side port blade, the anterior chamber was washed out with trypan blue to stain the anterior capsule, and the anterior chamber was filled with viscoelastic. A near clear corneal incision was performed with the steel keratome. A continuous curvilinear capsulorrhexis was performed with a cystotome followed by the capsulorrhexis forceps. Hydrodissection and hydrodelineation were carried out with BSS on a blunt cannula. The lens was removed in a divide and conquer  technique and the remaining cortical material was removed with the irrigation-aspiration handpiece. The capsular bag was inflated with viscoelastic and the lens was placed in the capsular bag without complication. The remaining viscoelastic was removed from the eye with the irrigation-aspiration handpiece. The wounds were hydrated. The anterior chamber was flushed and the eye was inflated to physiologic pressure. 0.58ml Vigamox was placed in the anterior chamber. The wounds were found to be water tight. The eye was dressed with Vigamox. The patient was given protective glasses to wear throughout the day and a shield with which to  sleep tonight. The patient was also given drops with which to begin a drop regimen today and will follow-up with me in one day. Implant Name Type Inv. Item Serial No. Manufacturer Lot No. LRB No. Used Action  LENS IOL DIOP 19.5 - [id number] Intraocular Lens LENS IOL DIOP 19.5 [id number] [employer]OH[employer]ON   Left 1 Implanted    Procedure(s) with comments: CATARACT EXTRACTION PHACO AND INTRAOCULAR LENS PLACEMENT (IOC) LEFT DIABETIC VISION  3.40 00:33.8   (Left) - Diabetic - oral meds  Electronically signed: Jailey Booton 12/04/2019 8:10 AM

## 2019-12-04 NOTE — Anesthesia Preprocedure Evaluation (Signed)
Anesthesia Evaluation  Patient identified by MRN, date of birth, ID band Patient awake    Reviewed: Allergy & Precautions, H&P , NPO status , Patient's Chart, lab work & pertinent test results  Airway Mallampati: III  TM Distance: >3 FB Neck ROM: full    Dental no notable dental hx.    Pulmonary shortness of breath,    Pulmonary exam normal breath sounds clear to auscultation       Cardiovascular hypertension, Normal cardiovascular exam Rhythm:regular Rate:Normal     Neuro/Psych    GI/Hepatic GERD  ,  Endo/Other  diabetes, Type 2Morbid obesity  Renal/GU      Musculoskeletal   Abdominal   Peds  Hematology   Anesthesia Other Findings   Reproductive/Obstetrics                             Anesthesia Physical Anesthesia Plan  ASA: III  Anesthesia Plan: MAC   Post-op Pain Management:    Induction:   PONV Risk Score and Plan: 2 and Treatment may vary due to age or medical condition, TIVA and Midazolam  Airway Management Planned:   Additional Equipment:   Intra-op Plan:   Post-operative Plan:   Informed Consent: I have reviewed the patients History and Physical, chart, labs and discussed the procedure including the risks, benefits and alternatives for the proposed anesthesia with the patient or authorized representative who has indicated his/her understanding and acceptance.     Dental Advisory Given  Plan Discussed with: CRNA  Anesthesia Plan Comments:         Anesthesia Quick Evaluation

## 2020-07-14 ENCOUNTER — Other Ambulatory Visit: Payer: Self-pay | Admitting: Gastroenterology

## 2020-07-14 ENCOUNTER — Other Ambulatory Visit (HOSPITAL_COMMUNITY): Payer: Self-pay | Admitting: Gastroenterology

## 2020-07-14 DIAGNOSIS — R1032 Left lower quadrant pain: Secondary | ICD-10-CM

## 2020-07-14 DIAGNOSIS — R1031 Right lower quadrant pain: Secondary | ICD-10-CM

## 2020-07-14 DIAGNOSIS — K219 Gastro-esophageal reflux disease without esophagitis: Secondary | ICD-10-CM

## 2020-07-21 ENCOUNTER — Other Ambulatory Visit: Payer: Self-pay | Admitting: Family Medicine

## 2020-07-21 DIAGNOSIS — Z1231 Encounter for screening mammogram for malignant neoplasm of breast: Secondary | ICD-10-CM

## 2020-08-11 ENCOUNTER — Ambulatory Visit
Admission: RE | Admit: 2020-08-11 | Discharge: 2020-08-11 | Disposition: A | Discharge status: Home / Self Care | Payer: Medicare HMO | Source: Ambulatory Visit | Attending: Gastroenterology | Admitting: Gastroenterology

## 2020-08-11 ENCOUNTER — Other Ambulatory Visit: Payer: Self-pay

## 2020-08-11 DIAGNOSIS — R1031 Right lower quadrant pain: Secondary | ICD-10-CM | POA: Diagnosis present

## 2020-08-11 DIAGNOSIS — K219 Gastro-esophageal reflux disease without esophagitis: Secondary | ICD-10-CM

## 2020-08-11 DIAGNOSIS — R1032 Left lower quadrant pain: Secondary | ICD-10-CM | POA: Diagnosis present

## 2020-10-13 ENCOUNTER — Other Ambulatory Visit: Payer: Self-pay

## 2020-10-13 ENCOUNTER — Ambulatory Visit
Admission: RE | Admit: 2020-10-13 | Discharge: 2020-10-13 | Disposition: A | Discharge status: Home / Self Care | Payer: Medicare HMO | Source: Ambulatory Visit | Attending: Family Medicine | Admitting: Family Medicine

## 2020-10-13 DIAGNOSIS — Z1231 Encounter for screening mammogram for malignant neoplasm of breast: Secondary | ICD-10-CM | POA: Insufficient documentation

## 2021-06-13 DIAGNOSIS — J309 Allergic rhinitis, unspecified: Secondary | ICD-10-CM | POA: Insufficient documentation

## 2021-08-08 DIAGNOSIS — N3281 Overactive bladder: Secondary | ICD-10-CM | POA: Insufficient documentation

## 2021-09-07 ENCOUNTER — Other Ambulatory Visit: Payer: Self-pay | Admitting: Family Medicine

## 2021-09-07 DIAGNOSIS — Z1231 Encounter for screening mammogram for malignant neoplasm of breast: Secondary | ICD-10-CM

## 2021-11-09 ENCOUNTER — Ambulatory Visit
Admission: RE | Admit: 2021-11-09 | Discharge: 2021-11-09 | Disposition: A | Discharge status: Home / Self Care | Payer: Medicare Other | Source: Ambulatory Visit | Attending: Family Medicine | Admitting: Family Medicine

## 2021-11-09 DIAGNOSIS — Z1231 Encounter for screening mammogram for malignant neoplasm of breast: Secondary | ICD-10-CM | POA: Diagnosis present

## 2021-11-22 ENCOUNTER — Other Ambulatory Visit: Payer: Self-pay | Admitting: Family Medicine

## 2021-11-22 DIAGNOSIS — R928 Other abnormal and inconclusive findings on diagnostic imaging of breast: Secondary | ICD-10-CM

## 2021-11-22 DIAGNOSIS — N63 Unspecified lump in unspecified breast: Secondary | ICD-10-CM

## 2021-12-16 ENCOUNTER — Ambulatory Visit
Admission: RE | Admit: 2021-12-16 | Discharge: 2021-12-16 | Disposition: A | Discharge status: Home / Self Care | Payer: Medicare Other | Source: Ambulatory Visit | Attending: Family Medicine | Admitting: Family Medicine

## 2021-12-16 DIAGNOSIS — R928 Other abnormal and inconclusive findings on diagnostic imaging of breast: Secondary | ICD-10-CM | POA: Insufficient documentation

## 2021-12-16 DIAGNOSIS — N63 Unspecified lump in unspecified breast: Secondary | ICD-10-CM | POA: Diagnosis present

## 2022-01-06 IMAGING — MG DIGITAL SCREENING BILAT W/ TOMO W/ CAD
8 series · 8 of 24 positions shown · non-contrast
Comparison: Previous exam(s).

CLINICAL DATA: Screening.

EXAM:
DIGITAL SCREENING BILATERAL MAMMOGRAM WITH TOMO AND CAD

[L CC synth-2D]
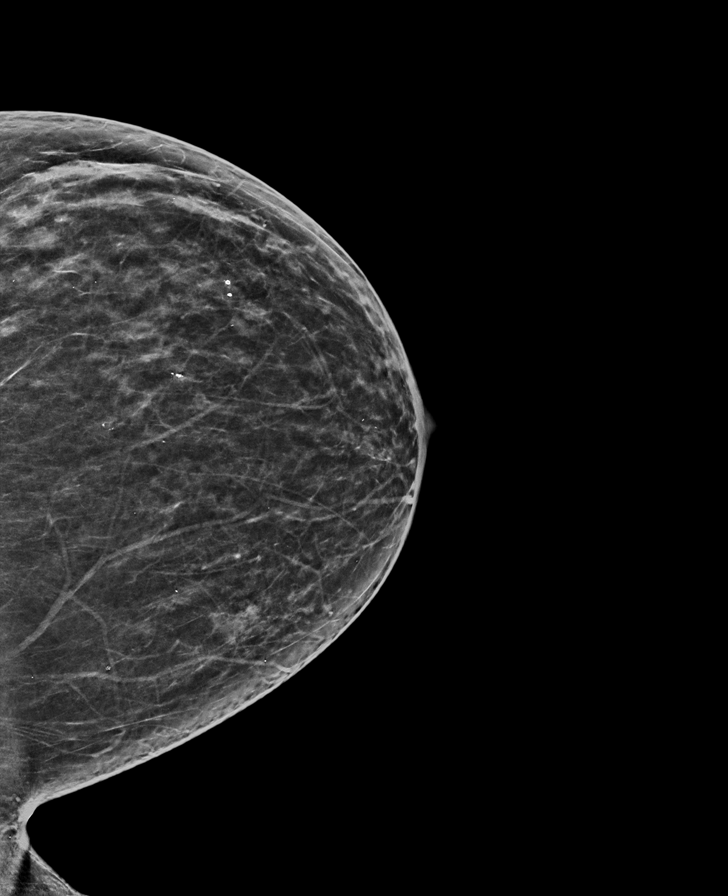

[R CC synth-2D]
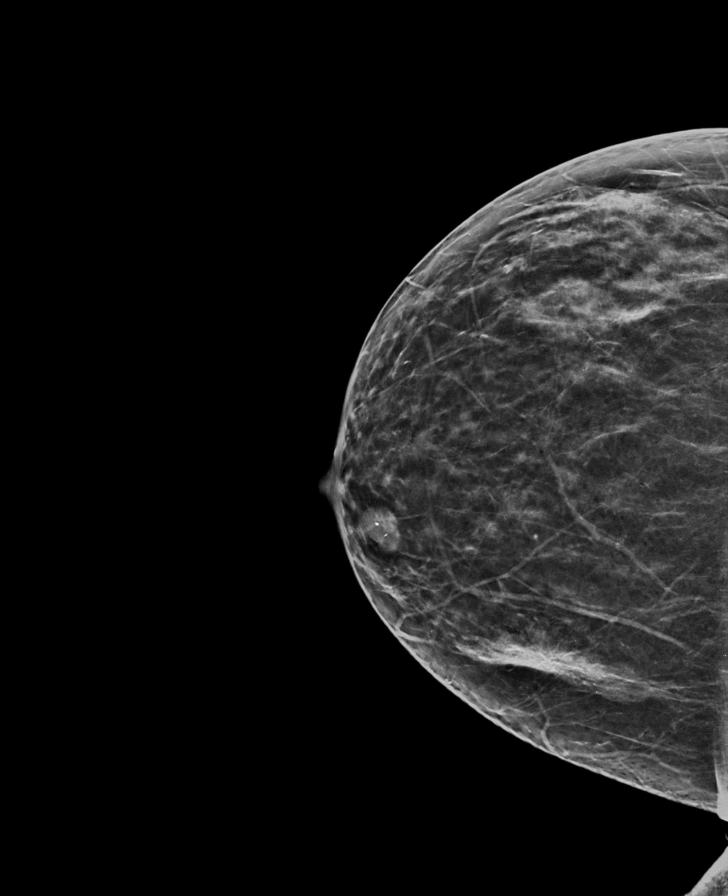

[R MLO synth-2D]
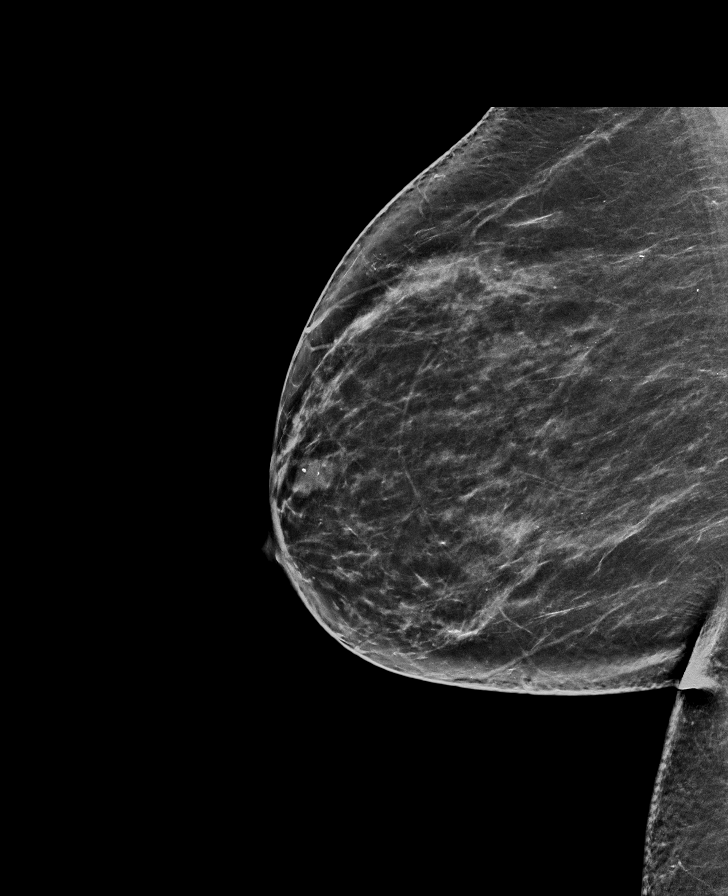

[L MLO synth-2D]
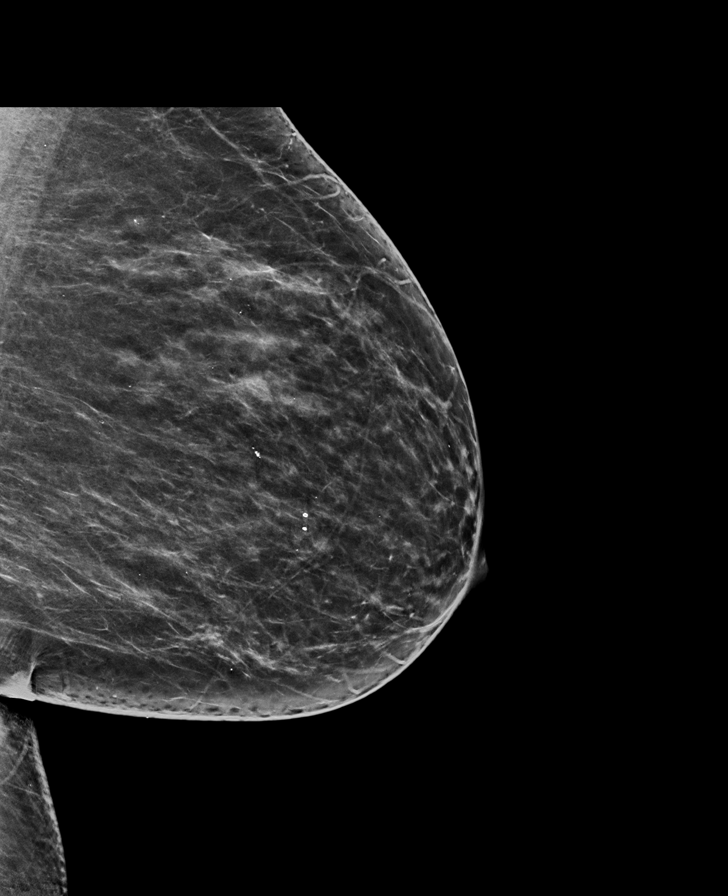

[R MLO tomo · tomo slice 37/72.0]
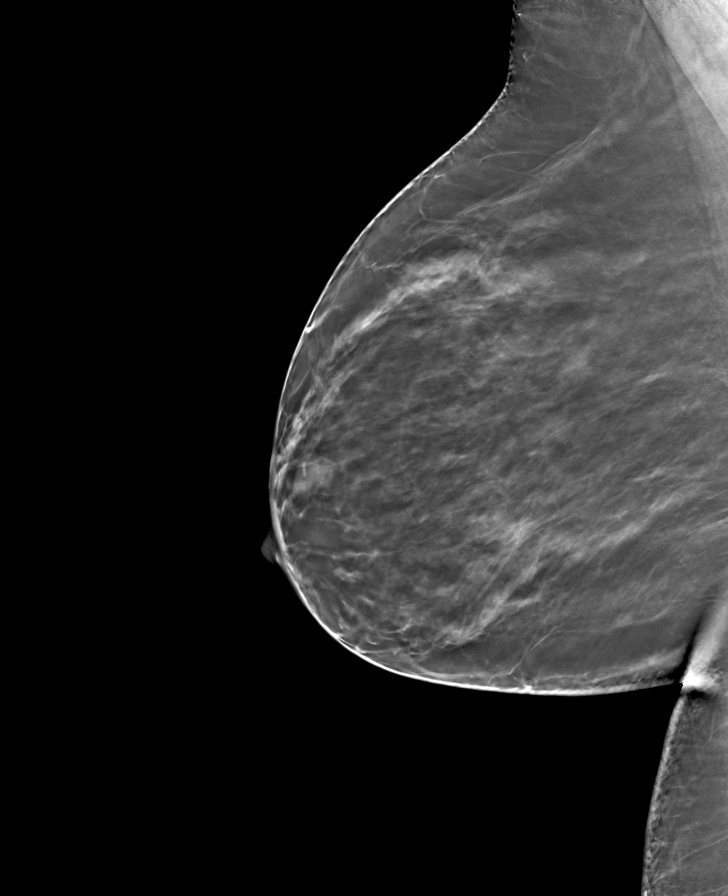

[L MLO tomo · tomo slice 37/73.0]
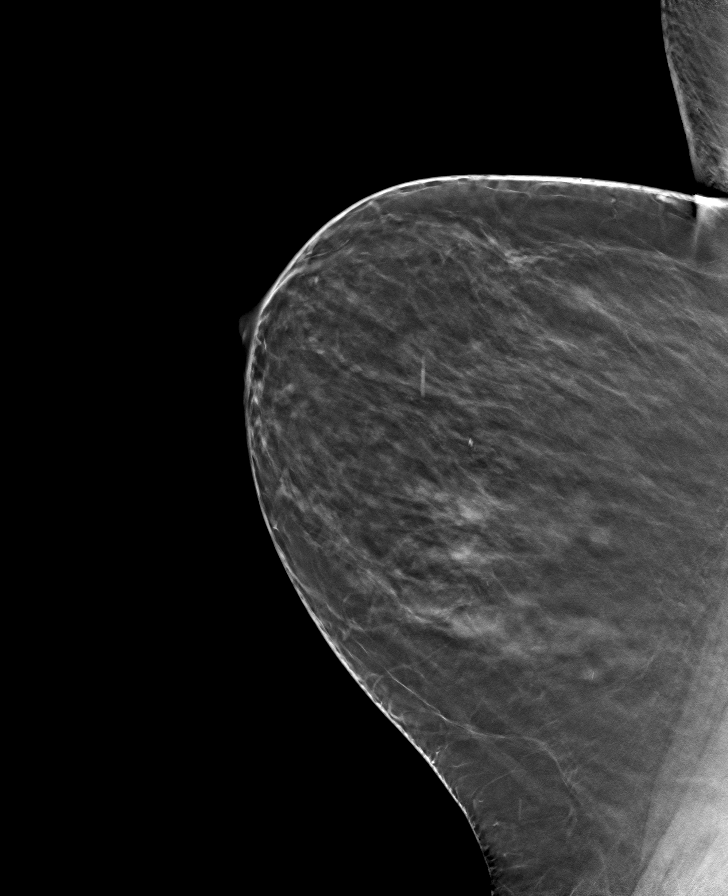

[L CC tomo · tomo slice 34/67.0]
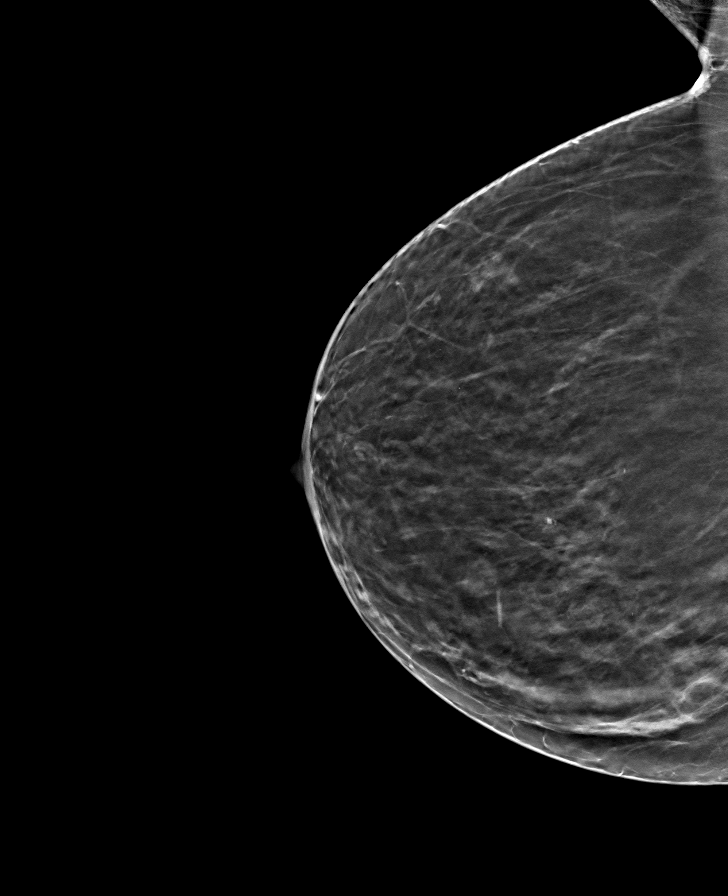

[R CC tomo · tomo slice 32/63.0]
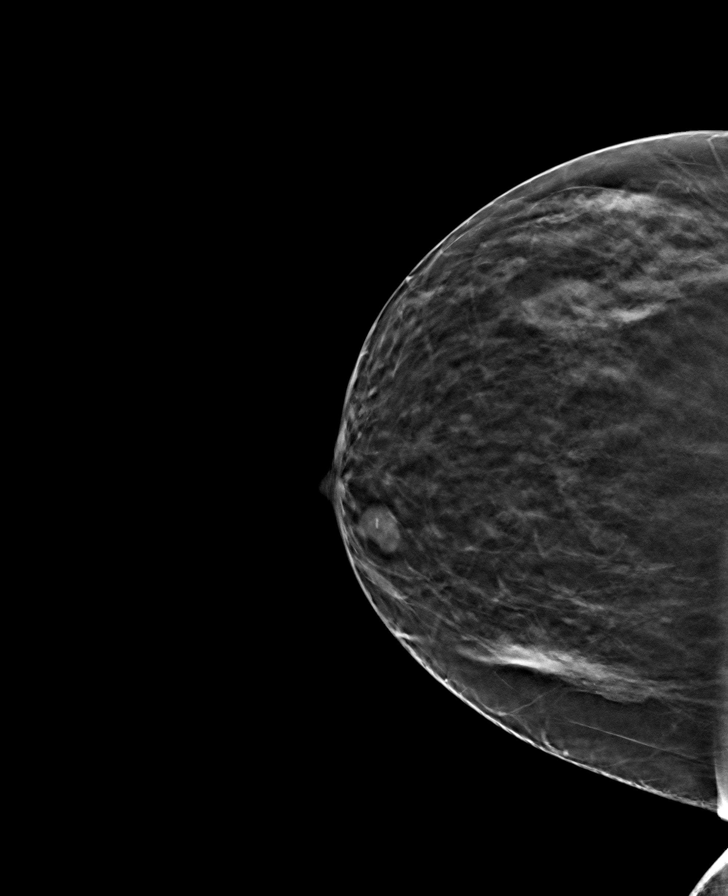

[8 of 24 positions shown; findings below may reference images not displayed]

ACR Breast Density Category b: There are scattered areas of
fibroglandular density.
FINDINGS: There are no findings suspicious for malignancy. Images were
processed with CAD.
IMPRESSION: No mammographic evidence of malignancy. A result letter of this
screening mammogram will be mailed directly to the patient.

RECOMMENDATION:
Screening mammogram in one year. (Code:[id number])

BI-RADS CATEGORY  1: Negative.

## 2022-01-12 DIAGNOSIS — H9201 Otalgia, right ear: Secondary | ICD-10-CM | POA: Insufficient documentation

## 2022-01-12 DIAGNOSIS — J411 Mucopurulent chronic bronchitis: Secondary | ICD-10-CM | POA: Insufficient documentation

## 2022-03-23 ENCOUNTER — Ambulatory Visit
Admission: RE | Admit: 2022-03-23 | Discharge: 2022-03-23 | Disposition: A | Discharge status: Home / Self Care | Payer: Medicare PPO | Source: Ambulatory Visit | Attending: Family Medicine | Admitting: Family Medicine

## 2022-03-23 ENCOUNTER — Other Ambulatory Visit: Payer: Self-pay | Admitting: Family Medicine

## 2022-03-23 DIAGNOSIS — R059 Cough, unspecified: Secondary | ICD-10-CM

## 2022-06-06 ENCOUNTER — Ambulatory Visit (INDEPENDENT_AMBULATORY_CARE_PROVIDER_SITE_OTHER): Payer: Medicare PPO | Admitting: Pulmonary Disease

## 2022-06-06 ENCOUNTER — Other Ambulatory Visit
Admission: RE | Admit: 2022-06-06 | Discharge: 2022-06-06 | Disposition: A | Discharge status: Home / Self Care | Payer: Medicare PPO | Attending: Pulmonary Disease | Admitting: Pulmonary Disease

## 2022-06-06 ENCOUNTER — Encounter: Payer: Self-pay | Admitting: Pulmonary Disease

## 2022-06-06 VITALS — BP 126/80 | HR 78 | Temp 97.3°F | Ht <= 58 in | Wt 226.0 lb

## 2022-06-06 DIAGNOSIS — R0602 Shortness of breath: Secondary | ICD-10-CM

## 2022-06-06 DIAGNOSIS — J45998 Other asthma: Secondary | ICD-10-CM | POA: Diagnosis not present

## 2022-06-06 LAB — CBC WITH DIFFERENTIAL/PLATELET
Abs Immature Granulocytes: 0.02 10*3/uL (ref 0.00–0.07)
Basophils Absolute: 0 10*3/uL (ref 0.0–0.1)
Basophils Relative: 0 %
Eosinophils Absolute: 0.6 10*3/uL — ABNORMAL HIGH (ref 0.0–0.5)
Eosinophils Relative: 7 %
HCT: 35.9 % — ABNORMAL LOW (ref 36.0–46.0)
Hemoglobin: 10.7 g/dL — ABNORMAL LOW (ref 12.0–15.0)
Immature Granulocytes: 0 %
Lymphocytes Relative: 20 %
Lymphs Abs: 1.9 10*3/uL (ref 0.7–4.0)
MCH: 18.8 pg — ABNORMAL LOW (ref 26.0–34.0)
MCHC: 29.8 g/dL — ABNORMAL LOW (ref 30.0–36.0)
MCV: 63 fL — ABNORMAL LOW (ref 80.0–100.0)
Monocytes Absolute: 0.6 10*3/uL (ref 0.1–1.0)
Monocytes Relative: 6 %
Neutro Abs: 6.2 10*3/uL (ref 1.7–7.7)
Neutrophils Relative %: 67 %
Platelets: 333 10*3/uL (ref 150–400)
RBC: 5.7 MIL/uL — ABNORMAL HIGH (ref 3.87–5.11)
RDW: 18.4 % — ABNORMAL HIGH (ref 11.5–15.5)
Smear Review: NORMAL
WBC: 9.4 10*3/uL (ref 4.0–10.5)
nRBC: 0 % (ref 0.0–0.2)

## 2022-06-06 LAB — NITRIC OXIDE: Nitric Oxide: 65

## 2022-06-06 MED ORDER — TRELEGY ELLIPTA 200-62.5-25 MCG/ACT IN AEPB: 1.0000 | INHALATION_SPRAY | Freq: Every day | RESPIRATORY_TRACT | 0 refills | Status: DC

## 2022-06-06 MED ORDER — TRELEGY ELLIPTA 200-62.5-25 MCG/ACT IN AEPB: 1.0000 | INHALATION_SPRAY | Freq: Every day | RESPIRATORY_TRACT | 11 refills | Status: DC

## 2022-06-06 NOTE — Patient Instructions (Signed)
You have asthma.  The test we did today shows that you have inflammation in the airway that is typical for asthma.  We are giving you a trial of an inhaler called TRELEGY ELLIPTA 200, this is 1 puff daily.  Make sure you rinse your mouth well after you use it.  This inhaler has 3 ingredients in it, too to open up your airways and want to reduce inflammation in the airway.  Let us know if you have any problems with the inhaler.  We have given you enough of a trial for approximately 1 month.  There has been a prescription sent to your the pharmacy at the [facility] so that you can continue the therapy until we see you again.  We have ordered some blood test to check for allergies.  You can stop by the lab at the main hospital to get your blood drawn.  The orders have been sent to the lab.  You will be set up for regular breathing tests.  You will be called to schedule these.  We will see you in follow-up in 4 to 6 weeks time call sooner should any new problems arise.

## 2022-06-06 NOTE — Progress Notes (Signed)
Subjective:    Patient ID: Whitney Banks, female    DOB: [birth year], 72 y.o.   MRN: [id number] Patient Care Team: Marguerita Merles, MD as PCP - General [facility])  Chief Complaint  Patient presents with   Consult    Wheezing, Cough with yellow sputum and SOB with exertion. For 6 months. Symptoms started after she had the Flu.   HPI Patient is a 72 year old lifelong never smoker who presents for evaluation of wheezing and shortness of breath of 4-6 months duration.  Patient is kindly referred by Dr. Delight Stare.  Patient notes that prior to the symptoms worsening, she had the flu.  This was on 12 February 2022, positive for influenza A.  She noted that this led to coughing and wheezing during her flu illness.  After the flu illness she noted shortness of breath when she walked particularly when she did fast walking or walking on inclines.  No chest pain.  She does note chest tightness.  She has used albuterol inhaler with mixed results.  Sometimes it relieves her dyspnea sometimes it does not.  Usually sitting down and resting helps her dyspnea.  She has not had orthopnea, paroxysmal nocturnal dyspnea nor lower extremity edema.  No calf tenderness.  When she has cough that is usually nonproductive but at times it will be productive of thick whitish sputum.  She does not endorse any other symptomatology.  Since her flu episode she does note that there is a seasonal variation particularly to cough and wheezing.  She did suffer from asthma as a child but states that she "outgrew it".  She does not have a significant occupational exposure history and she is currently retired.  She is married and lives with her husband in [place], [place].  She does not endorse any recent travel, no pets in the home.  Review of Systems A 10 point review of systems was performed and it is as noted above otherwise negative.  Past Medical History:  Diagnosis Date   Anemia    Arthritis    knees    Complication of anesthesia    Developed ileus after 2014 knee surgery   DM (diabetes mellitus)    Gallstones    GERD (gastroesophageal reflux disease)    Hearing difficulty, right    HTN (hypertension)    Obesity, Class III, 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y)    PONV (postoperative nausea and vomiting)    Stroke    Discovered by MRI.  Caused Right hearing deficit   Wears dentures    full upper, partial lower   Wears hearing aid in both ears    Past Surgical History:  Procedure Laterality Date   ABDOMINAL HYSTERECTOMY     CATARACT EXTRACTION W/PHACO Left 12/04/2019   Procedure: CATARACT EXTRACTION PHACO AND INTRAOCULAR LENS PLACEMENT (IOC) LEFT DIABETIC VISION  3.40 00:33.8  ;  Surgeon: Marchia Meiers, MD;  Location: [place];  Service: Ophthalmology;  Laterality: Left;  Diabetic - oral meds   COLONOSCOPY WITH PROPOFOL N/A 05/07/2015   Procedure: COLONOSCOPY WITH PROPOFOL;  Surgeon: Hulen Luster, MD;  Location: [facility] ENDOSCOPY;  Service: Gastroenterology;  Laterality: N/A;   COLONOSCOPY WITH PROPOFOL N/A 11/14/2018   Procedure: COLONOSCOPY WITH PROPOFOL;  Surgeon: Lollie Sails, MD;  Location: [facility] ENDOSCOPY;  Service: Endoscopy;  Laterality: N/A;   ESOPHAGOGASTRODUODENOSCOPY (EGD) WITH PROPOFOL N/A 05/07/2015   Procedure: ESOPHAGOGASTRODUODENOSCOPY (EGD) WITH PROPOFOL;  Surgeon: Hulen Luster, MD;  Location: [facility] ENDOSCOPY;  Service: Gastroenterology;  Laterality: N/A;   ESOPHAGOGASTRODUODENOSCOPY (EGD) WITH PROPOFOL N/A 11/14/2018   Procedure: ESOPHAGOGASTRODUODENOSCOPY (EGD) WITH PROPOFOL;  Surgeon: Lollie Sails, MD;  Location: [facility] ENDOSCOPY;  Service: Endoscopy;  Laterality: N/A;   gallstones     JOINT REPLACEMENT     RTK   PARTIAL HYSTERECTOMY     Patient Active Problem List   Diagnosis Date Noted   Dyspnea 10/08/2012   Essential hypertension 10/08/2012   Pulmonary hypertension 10/08/2012   Family History  Problem Relation Age of Onset   Heart attack Father    Breast  cancer Sister 22   Breast cancer Sister    Social History   Tobacco Use   Smoking status: Never   Smokeless tobacco: Never   Tobacco comments:    tried as teenager  Substance Use Topics   Alcohol use: No   Allergies  Allergen Reactions   Penicillins Hives   Lisinopril Cough   Current Meds  Medication Sig   albuterol (PROVENTIL) (2.5 MG/3ML) 0.083% nebulizer solution Take 2.5 mg by nebulization every 6 (six) hours as needed.   albuterol (VENTOLIN HFA) 108 (90 Base) MCG/ACT inhaler Inhale 1 puff into the lungs every 6 (six) hours as needed.   ascorbic acid (VITAMIN C) 500 MG tablet Take 500 mg by mouth daily.   cetirizine (ZYRTEC) 10 MG tablet Take 10 mg by mouth daily.   Cyanocobalamin (VITAMIN B-12) 2500 MCG SUBL Take 1 tablet by mouth daily.   Docusate Calcium (STOOL SOFTENER PO) Take by mouth daily.   ferrous gluconate (FERGON) 325 MG tablet Take 325 mg by mouth daily with breakfast.   losartan-hydrochlorothiazide (HYZAAR) 50-12.5 MG tablet Take 1 tablet by mouth daily.   pantoprazole (PROTONIX) 40 MG tablet Take 40 mg by mouth daily.   Potassium 99 MG TABS Take 1 tablet by mouth daily.   zinc gluconate 50 MG tablet Take 50 mg by mouth daily.   Immunization History  Administered Date(s) Administered   Moderna Covid-19 Vaccine Bivalent Booster 40yrs & up 12/01/2020        Objective:   Physical Exam BP 126/80 (BP Location: Left Arm, Cuff Size: Large)   Pulse 78   Temp (!) 97.3 F (36.3 C)   Ht 4\' 8"  (1.422 m)   Wt 226 lb (102.5 kg)   SpO2 100%   BMI 50.67 kg/m   SpO2: 100 % O2 Device: None (Room air)  GENERAL: Obese woman, no acute distress, fully ambulatory, no conversational dyspnea. HEAD: Normocephalic, atraumatic.  EYES: Pupils equal, round, reactive to light.  No scleral icterus.  MOUTH: Dentition intact, oral mucosa moist.  No thrush. NECK: Supple. No thyromegaly. Trachea midline. No JVD.  No adenopathy. PULMONARY: Good air entry bilaterally.   Scattered rhonchi and wheezing throughout. CARDIOVASCULAR: S1 and S2. Regular rate and rhythm.  No rubs, murmurs or gallops heard. ABDOMEN: Obese, otherwise benign. MUSCULOSKELETAL: No joint deformity, no clubbing, no edema.  NEUROLOGIC: No overt focal deficit, no gait disturbance, speech is fluent. SKIN: Intact,warm,dry. PSYCH: Mood and behavior normal.  Lab Results  Component Value Date   NITRICOXIDE 65 06/06/2022  *Type II inflammation present    Assessment & Plan:     ICD-10-CM   1. SOB (shortness of breath)  R06.02 Nitric oxide    Pulmonary Function Test [facility] Only   Likely due to poorly compensated asthma Obesity may also be aggravating issue    2. Uncontrolled persistent asthma  J45.998 Pulmonary Function Test [facility] Only    Allergen Panel (27) +  IGE    CBC with Differential/Platelet    CANCELED: CBC with Differential/Platelet    CANCELED: Allergen Panel (27) + IGE   Evidence of increased type II inflammation Bronchospasm noted on exam Start Trelegy 200, continue as needed albuterol PFTs    3. Morbid obesity with body mass index (BMI) greater than or equal to 50  E66.01    Adds complexity to her management Will benefit from weight loss     Orders Placed This Encounter  Procedures   Allergen Panel (27) + IGE    Standing Status:   Future    Number of Occurrences:   1    Standing Expiration Date:   12/06/2022   CBC with Differential/Platelet    Standing Status:   Future    Number of Occurrences:   1    Standing Expiration Date:   06/06/2023   Nitric oxide   Pulmonary Function Test [facility] Only    Standing Status:   Future    Standing Expiration Date:   06/06/2023   Meds ordered this encounter  Medications   Fluticasone-Umeclidin-Vilant (TRELEGY ELLIPTA) 200-62.5-25 MCG/ACT AEPB    Sig: Inhale 1 puff into the lungs daily.    Dispense:  28 each    Refill:  0    Order Specific Question:   Lot Number?    Answer:   [id number]    Order Specific Question:   Expiration Date?     Answer:   08/05/2023    Order Specific Question:   Quantity    Answer:   2   Fluticasone-Umeclidin-Vilant (TRELEGY ELLIPTA) 200-62.5-25 MCG/ACT AEPB    Sig: Inhale 1 puff into the lungs daily.    Dispense:  28 each    Refill:  11   Will see the patient in follow-up in 4 to 6 weeks time she is to call sooner should any new problems arise.  Renold Don, MD Advanced Bronchoscopy PCCM [facility]    *This note was dictated using voice recognition software/Dragon.  Despite best efforts to proofread, errors can occur which can change the meaning. Any transcriptional errors that result from this process are unintentional and may not be fully corrected at the time of dictation.

## 2022-06-20 LAB — ALLERGEN PANEL (27) + IGE
Alternaria Alternata IgE: 0.18 kU/L — AB
Aspergillus Fumigatus IgE: 0.11 kU/L — AB
Bahia Grass IgE: 8.51 kU/L — AB
Bermuda Grass IgE: 11.8 kU/L — AB
Cat Dander IgE: <0.1 kU/L
Cedar, Mountain IgE: 3.57 kU/L — AB
Cladosporium Herbarum IgE: 0.14 kU/L — AB
Cockroach, American IgE: 0.62 kU/L — AB
D Farinae IgE: 1.06 kU/L — AB
D Pteronyssinus IgE: 1.64 kU/L — AB
Dog Dander IgE: 0.14 kU/L — AB
Elm, American IgE: 0.29 kU/L — AB
IgE (Immunoglobulin E), Serum: 1348 IU/mL — ABNORMAL HIGH (ref 6–495)
Johnson Grass IgE: 6.13 kU/L — AB
Kentucky Bluegrass IgE: 11.6 kU/L — AB
Maple/Box Elder IgE: 0.36 kU/L — AB
Mucor Racemosus IgE: 0.19 kU/L — AB
Oak, White IgE: <0.1 kU/L
Penicillium Chrysogen IgE: 0.56 kU/L — AB
Pigweed, Rough IgE: 0.1 kU/L — AB
Plantain, English IgE: 0.19 kU/L — AB
Ragweed, Short IgE: 1.81 kU/L — AB
Timothy Grass IgE: 6.81 kU/L — AB

## 2022-06-23 ENCOUNTER — Telehealth: Payer: Self-pay | Admitting: Pulmonary Disease

## 2022-06-23 ENCOUNTER — Other Ambulatory Visit (HOSPITAL_COMMUNITY): Payer: Self-pay

## 2022-06-23 ENCOUNTER — Ambulatory Visit: Admitting: Cardiovascular Disease

## 2022-06-23 NOTE — Telephone Encounter (Signed)
I have notified the patient. She will call us back once she has talked to [employer].  Nothing further needed.

## 2022-06-23 NOTE — Telephone Encounter (Signed)
Please see what a cheaper alternative would be. Thank you!

## 2022-06-23 NOTE — Telephone Encounter (Signed)
Pt asking for alternative to Trelegy co pay is $166.

## 2022-06-23 NOTE — Telephone Encounter (Signed)
Please have patient contact [employer] for alternatives, patient only has pharmacy benefits through them so we cannot do benefits investigation.

## 2022-06-26 MED ORDER — TRELEGY ELLIPTA 200-62.5-25 MCG/ACT IN AEPB: 1.0000 | INHALATION_SPRAY | Freq: Every day | RESPIRATORY_TRACT | 11 refills | Status: AC

## 2022-06-26 NOTE — Telephone Encounter (Signed)
I have sent in the prescript to the fax number provider ([employer]). I notified the patient.  Nothing further needed.

## 2022-06-26 NOTE — Addendum Note (Signed)
Addended by: Bonney Leitz on: 06/26/2022 03:02 PM   Modules accepted: Orders

## 2022-06-26 NOTE — Telephone Encounter (Signed)
Pt states the trelegy rx needs to be faxed to [phone] for the [facility] to pay [facility] it

## 2022-06-30 ENCOUNTER — Ambulatory Visit: Payer: Medicare PPO | Attending: Pulmonary Disease

## 2022-06-30 DIAGNOSIS — J45998 Other asthma: Secondary | ICD-10-CM | POA: Diagnosis not present

## 2022-06-30 DIAGNOSIS — R0602 Shortness of breath: Secondary | ICD-10-CM | POA: Insufficient documentation

## 2022-06-30 LAB — PULMONARY FUNCTION TEST ARMC ONLY
DL/VA % pred: 98 %
DL/VA: 4.25 ml/min/mmHg/L
DLCO unc % pred: 80 %
DLCO unc: 13.2 ml/min/mmHg
FEF 25-75 Post: 2.73 L/sec
FEF 25-75 Pre: 2.72 L/sec
FEF2575-%Change-Post: 0 %
FEF2575-%Pred-Post: 163 %
FEF2575-%Pred-Pre: 162 %
FEV1-%Change-Post: 2 %
FEV1-%Pred-Post: 88 %
FEV1-%Pred-Pre: 86 %
FEV1-Post: 1.63 L
FEV1-Pre: 1.59 L
FEV1FVC-%Change-Post: -2 %
FEV1FVC-%Pred-Pre: 118 %
FEV6-%Change-Post: 4 %
FEV6-%Pred-Post: 79 %
FEV6-%Pred-Pre: 76 %
FEV6-Post: 1.86 L
FEV6-Pre: 1.77 L
FEV6FVC-%Pred-Post: 104 %
FEV6FVC-%Pred-Pre: 104 %
FVC-%Change-Post: 4 %
FVC-%Pred-Post: 76 %
FVC-%Pred-Pre: 72 %
FVC-Post: 1.86 L
FVC-Pre: 1.77 L
Post FEV1/FVC ratio: 88 %
Post FEV6/FVC ratio: 100 %
Pre FEV1/FVC ratio: 90 %
Pre FEV6/FVC Ratio: 100 %
RV % pred: 98 %
RV: 1.9 L
TLC % pred: 86 %
TLC: 3.71 L

## 2022-06-30 MED ORDER — ALBUTEROL SULFATE (2.5 MG/3ML) 0.083% IN NEBU
2.5000 mg | INHALATION_SOLUTION | Freq: Once | RESPIRATORY_TRACT | Status: AC
  Administered 2022-06-30: 2.5 mg via RESPIRATORY_TRACT
  Filled 2022-06-30: qty 3

## 2022-07-07 ENCOUNTER — Telehealth: Payer: Self-pay | Admitting: *Deleted

## 2022-07-07 ENCOUNTER — Encounter: Payer: Self-pay | Admitting: Cardiovascular Disease

## 2022-07-07 ENCOUNTER — Ambulatory Visit: Attending: Cardiovascular Disease | Admitting: Cardiovascular Disease

## 2022-07-07 ENCOUNTER — Other Ambulatory Visit
Admission: RE | Admit: 2022-07-07 | Discharge: 2022-07-07 | Disposition: A | Discharge status: Home / Self Care | Payer: Medicare PPO | Source: Ambulatory Visit | Attending: Cardiovascular Disease | Admitting: Cardiovascular Disease

## 2022-07-07 VITALS — BP 138/80 | HR 70 | Ht 59.0 in | Wt 224.5 lb

## 2022-07-07 DIAGNOSIS — R072 Precordial pain: Secondary | ICD-10-CM | POA: Diagnosis not present

## 2022-07-07 DIAGNOSIS — I10 Essential (primary) hypertension: Secondary | ICD-10-CM | POA: Diagnosis not present

## 2022-07-07 DIAGNOSIS — R0602 Shortness of breath: Secondary | ICD-10-CM | POA: Diagnosis present

## 2022-07-07 DIAGNOSIS — E785 Hyperlipidemia, unspecified: Secondary | ICD-10-CM | POA: Diagnosis not present

## 2022-07-07 LAB — BASIC METABOLIC PANEL
Anion gap: 10 (ref 5–15)
BUN: 29 mg/dL — ABNORMAL HIGH (ref 8–23)
CO2: 27 mmol/L (ref 22–32)
Calcium: 9.5 mg/dL (ref 8.9–10.3)
Chloride: 101 mmol/L (ref 98–111)
Creatinine, Ser: 1.24 mg/dL — ABNORMAL HIGH (ref 0.44–1.00)
GFR, Estimated: 47 mL/min — ABNORMAL LOW (ref >60)
Glucose, Bld: 113 mg/dL — ABNORMAL HIGH (ref 70–99)
Potassium: 4.7 mmol/L (ref 3.5–5.1)
Sodium: 138 mmol/L (ref 135–145)

## 2022-07-07 MED ORDER — METOPROLOL TARTRATE 100 MG PO TABS: ORAL_TABLET | ORAL | 0 refills | Status: DC

## 2022-07-07 NOTE — Addendum Note (Signed)
Addended by: Sandi Mariscal on: 07/07/2022 03:02 PM   Modules accepted: Orders

## 2022-07-07 NOTE — Telephone Encounter (Signed)
Patient made aware of results and verbalized understanding.  Patient has been made aware to not pick up the Metoprolol for the Cardiac CT. This has been canceled.   Your provider has ordered a Lexiscan Stress test. This will take place at [facility]. Please report to the [facility] medical mall entrance. The volunteers at the first desk will direct you where to go.  [facility] MYOVIEW  Your provider has ordered a Stress Test with nuclear imaging. The purpose of this test is to evaluate the blood supply to your heart muscle. This procedure is referred to as a "Non-Invasive Stress Test." This is because other than having an IV started in your vein, nothing is inserted or "invades" your body. Cardiac stress tests are done to find areas of poor blood flow to the heart by determining the extent of coronary artery disease (CAD). Some patients exercise on a treadmill, which naturally increases the blood flow to your heart, while others who are unable to walk on a treadmill due to physical limitations will have a pharmacologic/chemical stress agent called Lexiscan . This medicine will mimic walking on a treadmill by temporarily increasing your coronary blood flow.   Please note: these test may take anywhere between 2-4 hours to complete  How to prepare for your Myoview test:  Nothing to eat for 6 hours prior to the test No caffeine for 24 hours prior to test No smoking 24 hours prior to test. Your medication may be taken with water.  If your doctor stopped a medication because of this test, do not take that medication. Ladies, please do not wear dresses.  Skirts or pants are appropriate. Please wear a short sleeve shirt. No perfume, cologne or lotion. Wear comfortable walking shoes. No heels!   PLEASE NOTIFY THE OFFICE AT LEAST 24 HOURS IN ADVANCE IF YOU ARE UNABLE TO KEEP YOUR APPOINTMENT.  [phone] AND  PLEASE NOTIFY NUCLEAR MEDICINE AT [facility] AT LEAST 24 HOURS IN ADVANCE IF YOU ARE UNABLE TO KEEP YOUR  APPOINTMENT. [phone]

## 2022-07-07 NOTE — Patient Instructions (Addendum)
Medication Instructions:  No changes *If you need a refill on your cardiac medications before your next appointment, please call your pharmacy*   Lab Work: Your provider would like for you to have following labs drawn: BMET.   Please go to the [facility] entrance and check in at the front desk.  You do not need an appointment.  They are open from 7am-6 pm.   If you have labs (blood work) drawn today and your tests are completely normal, you will receive your results only by: MyChart Message (if you have MyChart) OR A paper copy in the mail If you have any lab test that is abnormal or we need to change your treatment, we will call you to review the results.   Testing/Procedures: Your physician has requested that you have an echocardiogram. Echocardiography is a painless test that uses sound waves to create images of your heart. It provides your doctor with information about the size and shape of your heart and how well your heart's chambers and valves are working.   You may receive an ultrasound enhancing agent through an IV if needed to better visualize your heart during the echo. This procedure takes approximately one hour.  There are no restrictions for this procedure.  This will take place at [address], [place] [address]    Follow-Up: At [facility], you and your health needs are our priority.  As part of our continuing mission to provide you with exceptional heart care, we have created designated Provider Care Teams.  These Care Teams include your primary Cardiologist (physician) and Advanced Practice Providers (APPs -  Physician Assistants and Nurse Practitioners) who all work together to provide you with the care you need, when you need it.  We recommend signing up for the patient portal called "MyChart".  Sign up information is provided on this After Visit Summary.  MyChart is used to connect with patients for Virtual  Visits (Telemedicine).  Patients are able to view lab/test results, encounter notes, upcoming appointments, etc.  Non-urgent messages can be sent to your provider as well.   To learn more about what you can do with MyChart, go to [email or web address].    Your next appointment:   Follow up with Dr. Kirke Corin or APP after testing Other Instructions   Your cardiac CT will be scheduled at one of the below locations:   [facility] [address] [place], [place] [address] [phone]  OR  [facility] [address] [place], [place] [address] [phone]  OR   [facility] [address] [place], [place] [address] [phone]  If scheduled at [facility], please arrive at the [facility] and Children's Entrance (Entrance C2) of [facility] 30 minutes prior to test start time. You can use the FREE valet parking offered at entrance C (encouraged to control the heart rate for the test)  Proceed to the [facility] Radiology Department (first floor) to check-in and test prep.  All radiology patients and guests should use entrance C2 at [facility], accessed from [facility], even though the hospital's physical address listed is [address]    If scheduled at [facility] or [facility], please arrive 15 mins early for check-in and test prep.   Please follow these instructions carefully (unless otherwise directed):  On the Night Before the Test: Be sure  to Drink plenty of water. Do not consume any caffeinated/decaffeinated beverages or chocolate 12 hours prior to your test. Do not take any antihistamines 12 hours prior to your test.  On the Day of the Test: Drink plenty of water until 1 hour prior to the test. Do not eat any food 1 hour prior to test. You may take your regular medications prior to the test.   Take metoprolol (Lopressor) two hours prior to test. If you take Furosemide/Hydrochlorothiazide/Spironolactone, please HOLD on the morning of the test. FEMALES- please wear underwire-free bra if available, avoid dresses & tight clothing      After the Test: Drink plenty of water. After receiving IV contrast, you may experience a mild flushed feeling. This is normal. On occasion, you may experience a mild rash up to 24 hours after the test. This is not dangerous. If this occurs, you can take Benadryl 25 mg and increase your fluid intake. If you experience trouble breathing, this can be serious. If it is severe call 911 IMMEDIATELY. If it is mild, please call our office. If you take any of these medications: Glipizide/Metformin, Avandament, Glucavance, please do not take 48 hours after completing test unless otherwise instructed.  We will call to schedule your test 2-4 weeks out understanding that some insurance companies will need an authorization prior to the service being performed.   For non-scheduling related questions, please contact the cardiac imaging nurse navigator should you have any questions/concerns: Rockwell [place], Cardiac Imaging Nurse Navigator Larey Brick, Cardiac Imaging Nurse Navigator [facility] Direct Office Dial: [phone]   For scheduling needs, including cancellations and rescheduling, please call [place], [phone].

## 2022-07-07 NOTE — Progress Notes (Signed)
Cardiology Office Note   Date:  07/07/2022   ID:  Whitney Banks, DOB Jul 17, 1950, MRN [id number]  PCP:  Leanna Sato, MD  Cardiologist:   Lorine Bears, MD   Chief Complaint  Patient presents with   New Patient (Initial Visit)    HTN/SOB. Meds reviewed verbally with pt.      History of Present Illness: Whitney Banks is a 72 y.o. female who presents to establish cardiovascular care due to her concerns about her risk factors.  Her mother was one of my patients and died of congestive heart failure at the age of 57.  I also saw this patient before in 2014. She has past medical history of essential hypertension, asthma, obesity and diabetes mellitus. In 2014, she had right knee replacement and presented shortly after that with small bowel obstruction complicated by acute renal failure.  Troponin was mildly elevated.  Echocardiogram showed normal LV systolic function with possible septal hypokinesis.  There was mild tricuspid regurgitation and moderate to severe pulmonary hypertension.  She had a Lexiscan Myoview that showed no evidence of ischemia with normal ejection fraction.  A follow-up echocardiogram was done which showed normal LV systolic function and pulmonary pressure.  She was most recently seen by me in 2014.  She reports recent worsening of exertional dyspnea with no orthopnea, PND or lower extremity edema.  In addition, she reports some episodes of substernal chest discomfort described as tightness feeling which can happen at rest or with exertion.  She has been diabetic for about 10 years.  She is not a smoker.  No recent cardiac testing.   Past Medical History:  Diagnosis Date   Anemia    Arthritis    knees   Complication of anesthesia    Developed ileus after 2014 knee surgery   DM (diabetes mellitus) (HCC)    Gallstones    GERD (gastroesophageal reflux disease)    Hearing difficulty, right    HTN (hypertension)    Obesity, Class III, BMI 40-49.9 (morbid  obesity) (HCC)    PONV (postoperative nausea and vomiting)    Stroke [facility])    Discovered by MRI.  Caused Right hearing deficit   Wears dentures    full upper, partial lower   Wears hearing aid in both ears     Past Surgical History:  Procedure Laterality Date   ABDOMINAL HYSTERECTOMY     CATARACT EXTRACTION W/PHACO Left 12/04/2019   Procedure: CATARACT EXTRACTION PHACO AND INTRAOCULAR LENS PLACEMENT (IOC) LEFT DIABETIC VISION  3.40 00:33.8  ;  Surgeon: Elliot Cousin, MD;  Location: [facility];  Service: Ophthalmology;  Laterality: Left;  Diabetic - oral meds   COLONOSCOPY WITH PROPOFOL N/A 05/07/2015   Procedure: COLONOSCOPY WITH PROPOFOL;  Surgeon: Wallace Cullens, MD;  Location: [facility] ENDOSCOPY;  Service: Gastroenterology;  Laterality: N/A;   COLONOSCOPY WITH PROPOFOL N/A 11/14/2018   Procedure: COLONOSCOPY WITH PROPOFOL;  Surgeon: Christena Deem, MD;  Location: [facility] ENDOSCOPY;  Service: Endoscopy;  Laterality: N/A;   ESOPHAGOGASTRODUODENOSCOPY (EGD) WITH PROPOFOL N/A 05/07/2015   Procedure: ESOPHAGOGASTRODUODENOSCOPY (EGD) WITH PROPOFOL;  Surgeon: Wallace Cullens, MD;  Location: [facility] ENDOSCOPY;  Service: Gastroenterology;  Laterality: N/A;   ESOPHAGOGASTRODUODENOSCOPY (EGD) WITH PROPOFOL N/A 11/14/2018   Procedure: ESOPHAGOGASTRODUODENOSCOPY (EGD) WITH PROPOFOL;  Surgeon: Christena Deem, MD;  Location: [facility] ENDOSCOPY;  Service: Endoscopy;  Laterality: N/A;   gallstones     JOINT REPLACEMENT     RTK   PARTIAL HYSTERECTOMY  Current Outpatient Medications  Medication Sig Dispense Refill   ascorbic acid (VITAMIN C) 500 MG tablet Take 500 mg by mouth daily.     cetirizine (ZYRTEC) 10 MG tablet Take 10 mg by mouth daily.     Cyanocobalamin (VITAMIN B-12) 2500 MCG SUBL Take 1 tablet by mouth daily.     Docusate Calcium (STOOL SOFTENER PO) Take by mouth daily.     ferrous gluconate (FERGON) 325 MG tablet Take 325 mg by mouth daily with breakfast.     Fluticasone-Umeclidin-Vilant  (TRELEGY ELLIPTA) 200-62.5-25 MCG/ACT AEPB Inhale 1 puff into the lungs daily. 60 each 11   linagliptin (TRADJENTA) 5 MG TABS tablet Take 5 mg by mouth daily.     losartan-hydrochlorothiazide (HYZAAR) 50-12.5 MG tablet Take 1 tablet by mouth daily.     pantoprazole (PROTONIX) 40 MG tablet Take 40 mg by mouth daily.     Potassium 99 MG TABS Take 1 tablet by mouth daily.     zinc gluconate 50 MG tablet Take 50 mg by mouth daily.     No current facility-administered medications for this visit.    Allergies:   Penicillins and Lisinopril    Social History:  The patient  reports that she has never smoked. She has never used smokeless tobacco. She reports that she does not drink alcohol and does not use drugs.   Family History:  The patient's family history includes Breast cancer in her sister; Breast cancer (age of onset: 48) in her sister; Heart attack in her father; Heart failure in her mother.    ROS:  Please see the history of present illness.   Otherwise, review of systems are positive for none.   All other systems are reviewed and negative.    PHYSICAL EXAM: VS:  BP 138/80 (BP Location: Right Arm, Patient Position: Sitting, Cuff Size: Large)   Pulse 70   Ht 4\' 11"  (1.499 m)   Wt 224 lb 8 oz (101.8 kg)   SpO2 96%   BMI 45.34 kg/m  , BMI Body mass index is 45.34 kg/m. GEN: Well nourished, well developed, in no acute distress  HEENT: normal  Neck: no JVD, carotid bruits, or masses Cardiac: RRR; no murmurs, rubs, or gallops,no edema  Respiratory:  clear to auscultation bilaterally, normal work of breathing GI: soft, nontender, nondistended, + BS MS: no deformity or atrophy  Skin: warm and dry, no rash Neuro:  Strength and sensation are intact Psych: euthymic mood, full affect   EKG:  EKG is ordered today. The ekg ordered today demonstrates normal sinus rhythm with low voltage.   Recent Labs: 06/06/2022: Hemoglobin 10.7; Platelets 333    Lipid Panel No results found for:  "CHOL", "TRIG", "HDL", "CHOLHDL", "VLDL", "LDLCALC", "LDLDIRECT"    Wt Readings from Last 3 Encounters:  07/07/22 224 lb 8 oz (101.8 kg)  06/06/22 226 lb (102.5 kg)  12/04/19 220 lb (99.8 kg)          07/07/2022    9:27 AM  PAD Screen  Previous PAD dx? No  Previous surgical procedure? No  Pain with walking? No  Feet/toe relief with dangling? No  Painful, non-healing ulcers? No  Extremities discolored? No      ASSESSMENT AND PLAN:  1.  Chest pain with multiple risk factors for coronary artery disease: EKG with no ischemic changes but low voltage.  She is not able to exercise on a treadmill.  I recommend evaluation with cardiac CTA.  2.  Exertional dyspnea: I requested an  echocardiogram to evaluate LV systolic/diastolic function.  3.  Essential hypertension: Blood pressure is controlled.  4.  Mild hyperlipidemia: Her LDL was around 114.  If cardiac CTA confirms significant coronary artery disease, she will require a statin.  Regardless though, given that she is diabetic, treatment with a statin should be considered.    Disposition:   Follow-up after cardiac testing.  Signed,  Lorine Bears, MD  07/07/2022 9:34 AM    [facility]

## 2022-07-07 NOTE — Telephone Encounter (Signed)
-----   Message from Iran Ouch, MD sent at 07/07/2022 10:55 AM EDT ----- She does have mildly abnormal renal function.  Due to that, it might be best to avoid giving her contrast.  I recommend switching him from cardiac CTA to [facility].

## 2022-07-11 ENCOUNTER — Encounter: Payer: Self-pay | Admitting: Pulmonary Disease

## 2022-07-11 ENCOUNTER — Ambulatory Visit: Payer: Medicare PPO | Admitting: Pulmonary Disease

## 2022-07-11 VITALS — BP 120/80 | HR 68 | Temp 97.9°F | Ht 59.0 in | Wt 225.2 lb

## 2022-07-11 DIAGNOSIS — J4540 Moderate persistent asthma, uncomplicated: Secondary | ICD-10-CM | POA: Diagnosis not present

## 2022-07-11 DIAGNOSIS — J309 Allergic rhinitis, unspecified: Secondary | ICD-10-CM

## 2022-07-11 DIAGNOSIS — R0602 Shortness of breath: Secondary | ICD-10-CM

## 2022-07-11 DIAGNOSIS — Z6841 Body Mass Index (BMI) 40.0 and over, adult: Secondary | ICD-10-CM

## 2022-07-11 LAB — NITRIC OXIDE: Nitric Oxide: 14

## 2022-07-11 NOTE — Patient Instructions (Signed)
Your level of inflammation today was back to normal.  This is good news.  Your lung function was good.  We are referring you to the allergist.  You have multiple allergies.  Continue taking Trelegy.  You may still use your albuterol inhaler as needed.  This is the rescue inhaler in case you get exposed to something that may trigger your asthma.  We will see you in follow-up in 4 to 6 months time call sooner should any new problems arise.

## 2022-07-11 NOTE — Progress Notes (Signed)
Subjective:    Patient ID: Whitney Banks, female    DOB: 09-Oct-1950, 72 y.o.   MRN: [id number] Patient Care Team: Leanna Sato, MD as PCP - General (Family Medicine) Iran Ouch, MD as PCP - Cardiology (Cardiology) Salena Saner, MD as Consulting Physician (Pulmonary Disease)  Chief Complaint  Patient presents with   Follow-up    No SOB, wheezing or cough.   HPI Whitney Banks is a 72 year old lifelong never smoker who presents for follow-up on wheezing and shortness of breath.  I first evaluated her on 06 June 2022, this is a follow-up visit.  At her initial visit here she was noted to have significant rhonchi and wheezing and an elevated nitric oxide at 65 ppb consistent with type II inflammation typical of asthma.  The patient was started on allergy Ellipta 200, 1 inhalation daily.  CBC and allergen panel were obtained.  CBC showed that her absolute eosinophils were mildly elevated at 600 and allergen panel showed that she had an elevated IgE at 1348 and she exhibited significant response to dust mites grasses cedar, and ragweed.  It was recommended that she be evaluated by allergy and immunology.  Her PFTs were performed 30 June 2022 these were discussed with the patient.  She had essentially a normal pulmonary function test with low ERV consistent with obesity.  Since starting on Trelegy the patient notes that she has had no shortness of breath, no wheezing or cough.  She feels she has more stamina.  She has not had any fevers, chills or sweats.  No hemoptysis.  No gastroesophageal reflux symptoms.  No lower extremity edema nor calf tenderness.  Overall she feels well and looks well.   Review of Systems A 10 point review of systems was performed and it is as noted above otherwise negative.  Patient Active Problem List   Diagnosis Date Noted   Moderate persistent asthma without complication 07/11/2022   Dyspnea 10/08/2012   Essential hypertension 10/08/2012   Pulmonary  hypertension (HCC) 10/08/2012    Social History   Tobacco Use   Smoking status: Never   Smokeless tobacco: Never   Tobacco comments:    tried as teenager  Substance Use Topics   Alcohol use: No   Allergies  Allergen Reactions   Penicillins Hives   Lisinopril Cough   Current Meds  Medication Sig   albuterol (VENTOLIN HFA) 108 (90 Base) MCG/ACT inhaler Inhale into the lungs every 6 (six) hours as needed for wheezing or shortness of breath.   ascorbic acid (VITAMIN C) 500 MG tablet Take 500 mg by mouth daily.   cetirizine (ZYRTEC) 10 MG tablet Take 10 mg by mouth daily.   Cyanocobalamin (VITAMIN B-12) 2500 MCG SUBL Take 1 tablet by mouth daily.   Docusate Calcium (STOOL SOFTENER PO) Take by mouth daily.   ferrous gluconate (FERGON) 325 MG tablet Take 325 mg by mouth daily with breakfast.   Fluticasone-Umeclidin-Vilant (TRELEGY ELLIPTA) 200-62.5-25 MCG/ACT AEPB Inhale 1 puff into the lungs daily.   linagliptin (TRADJENTA) 5 MG TABS tablet Take 5 mg by mouth daily.   losartan-hydrochlorothiazide (HYZAAR) 50-12.5 MG tablet Take 1 tablet by mouth daily.   pantoprazole (PROTONIX) 40 MG tablet Take 40 mg by mouth daily.   Potassium 99 MG TABS Take 1 tablet by mouth daily.   zinc gluconate 50 MG tablet Take 50 mg by mouth daily.   There is no immunization history for the selected administration types on file for this patient.  Objective:   Physical Exam BP 120/80 (BP Location: Left Arm, Cuff Size: Large)   Pulse 68   Temp 97.9 F (36.6 C)   Ht 4\' 11"  (1.499 m)   Wt 225 lb 3.2 oz (102.2 kg)   SpO2 100%   BMI 45.48 kg/m   SpO2: 100 % O2 Device: None (Room air)  GENERAL: Obese woman, no acute distress, fully ambulatory, no conversational dyspnea. HEAD: Normocephalic, atraumatic.  EYES: Pupils equal, round, reactive to light.  No scleral icterus.  MOUTH: Dentition intact, oral mucosa moist.  No thrush. NECK: Supple. No thyromegaly. Trachea midline. No JVD.  No  adenopathy. PULMONARY: Good air entry bilaterally.  No adventitious sounds. CARDIOVASCULAR: S1 and S2. Regular rate and rhythm.  No rubs, murmurs or gallops heard. ABDOMEN: Obese, otherwise benign. MUSCULOSKELETAL: No joint deformity, no clubbing, no edema.  NEUROLOGIC: No overt focal deficit, no gait disturbance, speech is fluent. SKIN: Intact,warm,dry. PSYCH: Mood and behavior normal.   Lab Results  Component Value Date   NITRICOXIDE 14 07/11/2022  *No evidence of type II inflammation (prior level 65 ppb).   Recent Results (from the past 2160 hour(s))  Nitric oxide     Status: None   Collection Time: 06/06/22  8:57 AM  Result Value Ref Range   Nitric Oxide 65   CBC with Differential/Platelet     Status: Abnormal   Collection Time: 06/06/22  9:44 AM  Result Value Ref Range   WBC 9.4 4.0 - 10.5 K/uL   RBC 5.70 (H) 3.87 - 5.11 MIL/uL   Hemoglobin 10.7 (L) 12.0 - 15.0 g/dL   HCT 95.6 (L) 21.3 - 08.6 %   MCV 63.0 (L) 80.0 - 100.0 fL   MCH 18.8 (L) 26.0 - 34.0 pg   MCHC 29.8 (L) 30.0 - 36.0 g/dL   RDW 57.8 (H) 46.9 - 62.9 %   Platelets 333 150 - 400 K/uL   nRBC 0.0 0.0 - 0.2 %   Neutrophils Relative % 67 %   Neutro Abs 6.2 1.7 - 7.7 K/uL   Lymphocytes Relative 20 %   Lymphs Abs 1.9 0.7 - 4.0 K/uL   Monocytes Relative 6 %   Monocytes Absolute 0.6 0.1 - 1.0 K/uL   Eosinophils Relative 7 %   Eosinophils Absolute 0.6 (H) 0.0 - 0.5 K/uL   Basophils Relative 0 %   Basophils Absolute 0.0 0.0 - 0.1 K/uL   RBC Morphology MORPHOLOGY UNREMARKABLE    Smear Review Normal platelet morphology    Immature Granulocytes 0 %   Abs Immature Granulocytes 0.02 0.00 - 0.07 K/uL    Comment: Performed at [facility], [address] [place], [place] [address]  Allergen Panel (27) + IGE     Status: Abnormal   Collection Time: 06/06/22  9:44 AM  Result Value Ref Range   Class Description Allergens Comment     Comment: (NOTE)    Levels of Specific IgE       Class  Description of  Class    ---------------------------  -----  --------------------                   < 0.10         0         Negative           0.10 -    0.31         0/I       Equivocal/Low  0.32 -    0.55         I         Low           0.56 -    1.40         II        Moderate           1.41 -    3.90         III       High           3.91 -   19.00         IV        Very High          19.01 -  100.00         V         Very High                  >100.00         VI        Very High    IgE (Immunoglobulin E), Serum 1,348 (H) 6 - 495 IU/mL   D Pteronyssinus IgE 1.64 (A) Class III kU/L   D Farinae IgE 1.06 (A) Class II kU/L   Cat Dander IgE <0.10 Class 0 kU/L   Dog Dander IgE 0.14 (A) Class 0/I kU/L   [place] Grass IgE 11.80 (A) Class IV kU/L   Timothy Grass IgE 6.81 (A) Class IV kU/L   [place] Bluegrass IgE 11.60 (A) Class IV kU/L   [place] Grass IgE 6.13 (A) Class IV kU/L   Bahia Grass IgE 8.51 (A) Class IV kU/L   Cockroach, American IgE 0.62 (A) Class II kU/L    Comment: (NOTE) This test was developed and its performance characteristics determined by [facility].  It has not been cleared or approved by the U.S. Food and Drug Administration. The FDA has determined that such clearance or approval is not necessary. This test is used for clinical purposes.  It should not be regarded as investigational or for research.    Penicillium Chrysogen IgE 0.56 (A) Class II kU/L   Cladosporium Herbarum IgE 0.14 (A) Class 0/I kU/L   Aspergillus Fumigatus IgE 0.11 (A) Class 0/I kU/L   Mucor Racemosus IgE 0.19 (A) Class 0/I kU/L   Alternaria Alternata IgE 0.18 (A) Class 0/I kU/L   Setomelanomma Rostrat QNSTST kU/L    Comment: (NOTE) Test not performed. Insufficient specimen to perform or complete analysis. Test not performed. Attempts to contact your facility were unsuccessful.      Attempted to contact 06/15/2022, 06/16/2022, and 06/19/2022    Oak, White IgE <0.10 Class 0 kU/L   Elm, American IgE  0.29 (A) Class 0/I kU/L   Maple/Box Elder IgE 0.36 (A) Class I kU/L   Common Silver Charletta Cousin IgE QNSTST kU/L    Comment: (NOTE) Test not performed. Insufficient specimen to perform or complete analysis. Test not performed. Attempts to contact your facility were unsuccessful.      Attempted to contact 06/15/2022, 06/16/2022, and 06/19/2022    Hickory, White IgE QNSTST kU/L    Comment: (NOTE) Test not performed. Insufficient specimen to perform or complete analysis. Test not performed. Attempts to contact your facility were unsuccessful.      Attempted to contact 06/15/2022, 06/16/2022, and 06/19/2022 This test was developed and its performance characteristics determined by [facility].  It has not been cleared or approved by the U.S.  Food and Drug Administration. The FDA has determined that such clearance or approval is not necessary. This test is used for clinical purposes.  It should not be regarded as investigational or for research.    White Mulberry IgE QNSTST kU/L    Comment: (NOTE) Test not performed. Insufficient specimen to perform or complete analysis. Test not performed. Attempts to contact your facility were unsuccessful.      Attempted to contact 06/15/2022, 06/16/2022, and 06/19/2022    Cedar, [place] IgE 3.57 (A) Class III kU/L   Ragweed, Short IgE 1.81 (A) Class III kU/L   Plantain, English IgE 0.19 (A) Class 0/I kU/L   Cocklebur IgE QNSTST kU/L    Comment: (NOTE) Test not performed. Insufficient specimen to perform or complete analysis. Test not performed. Attempts to contact your facility were unsuccessful.      Attempted to contact 06/15/2022, 06/16/2022, and 06/19/2022    Pigweed, Rough IgE 0.10 (A) Class 0/I kU/L    Comment: (NOTE) Performed At: [facility] [address] [place], [place] [id number] Jolene Schimke MD WU:[id number]   Pulmonary Function Test [facility] Only     Status: None   Collection Time: 06/30/22 11:44 AM  Result Value Ref Range    FVC-Pre 1.77 L   FVC-%Pred-Pre 72 %   FVC-Post 1.86 L   FVC-%Pred-Post 76 %   FVC-%Change-Post 4 %   FEV1-Pre 1.59 L   FEV1-%Pred-Pre 86 %   FEV1-Post 1.63 L   FEV1-%Pred-Post 88 %   FEV1-%Change-Post 2 %   FEV6-Pre 1.77 L   FEV6-%Pred-Pre 76 %   FEV6-Post 1.86 L   FEV6-%Pred-Post 79 %   FEV6-%Change-Post 4 %   Pre FEV1/FVC ratio 90 %   FEV1FVC-%Pred-Pre 118 %   Post FEV1/FVC ratio 88 %   FEV1FVC-%Change-Post -2 %   Pre FEV6/FVC Ratio 100 %   FEV6FVC-%Pred-Pre 104 %   Post FEV6/FVC ratio 100 %   FEV6FVC-%Pred-Post 104 %   FEF 25-75 Pre 2.72 L/sec   FEF2575-%Pred-Pre 162 %   FEF 25-75 Post 2.73 L/sec   FEF2575-%Pred-Post 163 %   FEF2575-%Change-Post 0 %   RV 1.90 L   RV % pred 98 %   TLC 3.71 L   TLC % pred 86 %   DLCO unc 13.20 ml/min/mmHg   DLCO unc % pred 80 %   DL/VA 9.56 ml/min/mmHg/L   DL/VA % pred 98 %  Basic metabolic panel     Status: Abnormal   Collection Time: 07/07/22 10:23 AM  Result Value Ref Range   Sodium 138 135 - 145 mmol/L   Potassium 4.7 3.5 - 5.1 mmol/L   Chloride 101 98 - 111 mmol/L   CO2 27 22 - 32 mmol/L   Glucose, Bld 113 (H) 70 - 99 mg/dL    Comment: Glucose reference range applies only to samples taken after fasting for at least 8 hours.   BUN 29 (H) 8 - 23 mg/dL   Creatinine, Ser 2.13 (H) 0.44 - 1.00 mg/dL   Calcium 9.5 8.9 - 08.6 mg/dL   GFR, Estimated 47 (L) >60 mL/min    Comment: (NOTE) Calculated using the CKD-EPI Creatinine Equation (2021)    Anion gap 10 5 - 15    Comment: Performed at [facility], [address] [place], [place] [address]  Nitric oxide     Status: None   Collection Time: 07/11/22 10:02 AM  Result Value Ref Range   Nitric Oxide 14       Assessment &  Plan:     ICD-10-CM   1. Moderate persistent asthma without complication  J45.40 Nitric oxide    Ambulatory referral to Allergy   Continue Trelegy Ellipta 200, 1 puff daily Continue as needed albuterol Type II inflammation greatly reduced     2. Multiple respiratory allergies  J30.9    Referral to allergy Has elevated IgE, eosinophils Multiple allergy to grasses, dust mites, trees    3. SOB (shortness of breath)  R06.02 Nitric oxide   Resolved on Trelegy Was likely related to poorly compensated asthma    4. Morbid obesity with BMI of 45.0-49.9, adult (HCC)  E66.01    Z68.42    Would benefit from weight loss Obesity worsens inflammatory response in asthma     Orders Placed This Encounter  Procedures   Ambulatory referral to Allergy    Referral Priority:   Routine    Referral Type:   Allergy Testing    Referral Reason:   Specialty Services Required    Requested Specialty:   Allergy    Number of Visits Requested:   1   Nitric oxide   Patient is to continue Trelegy Ellipta.  She is also been instructed on proper use of albuterol.  Asthma action plan also discussed.  Will see the patient in follow-up in 4 to 6 months time call sooner should any new problems arise.  Gailen Shelter, MD Advanced Bronchoscopy PCCM  [facility]    *This note was dictated using voice recognition software/Dragon.  Despite best efforts to proofread, errors can occur which can change the meaning. Any transcriptional errors that result from this process are unintentional and may not be fully corrected at the time of dictation.

## 2022-07-27 ENCOUNTER — Encounter
Admission: RE | Admit: 2022-07-27 | Discharge: 2022-07-27 | Disposition: A | Discharge status: Home / Self Care | Payer: Medicare PPO | Source: Ambulatory Visit | Attending: Cardiovascular Disease | Admitting: Cardiovascular Disease

## 2022-07-27 DIAGNOSIS — R072 Precordial pain: Secondary | ICD-10-CM | POA: Insufficient documentation

## 2022-07-27 LAB — NM MYOCAR MULTI W/SPECT W/WALL MOTION / EF
Estimated workload: 1
Exercise duration (min): 0 min
Exercise duration (sec): 0 s
LV dias vol: 71 mL (ref 46–106)
Nuc Stress EF: 66 %
Peak HR: 100 {beats}/min
Percent HR: 66 %
Rest HR: 67 {beats}/min
TID: 1.03

## 2022-07-27 MED ORDER — REGADENOSON 0.4 MG/5ML IV SOLN
0.4000 mg | Freq: Once | INTRAVENOUS | Status: AC
  Administered 2022-07-27: 0.4 mg via INTRAVENOUS

## 2022-07-27 MED ORDER — TECHNETIUM TC 99M TETROFOSMIN IV KIT
30.0000 | PACK | Freq: Once | INTRAVENOUS | Status: AC | PRN
  Administered 2022-07-27: 32.16 via INTRAVENOUS

## 2022-07-27 MED ORDER — TECHNETIUM TC 99M TETROFOSMIN IV KIT
10.6000 | PACK | Freq: Once | INTRAVENOUS | Status: AC | PRN
  Administered 2022-07-27: 9.84 via INTRAVENOUS

## 2022-07-28 LAB — NM MYOCAR MULTI W/SPECT W/WALL MOTION / EF
LV sys vol: 24 mL
MPHR: 151 {beats}/min
Rest Nuclear Isotope Dose: 9.8 mCi
SDS: 0
SRS: 0
SSS: 5
ST Depression (mm): 0 mm
Stress Nuclear Isotope Dose: 32.2 mCi

## 2022-08-01 ENCOUNTER — Telehealth: Payer: Self-pay | Admitting: Cardiovascular Disease

## 2022-08-01 NOTE — Telephone Encounter (Signed)
The patient has been notified of the result along with recommendations.Pt verbalized understanding. All questions (if any) were answered       Iran Ouch, MD 07/31/2022 11:57 AM EDT     Inform patient that  stress test was normal.

## 2022-08-01 NOTE — Telephone Encounter (Signed)
Patient states she is returning a call. 

## 2022-08-04 ENCOUNTER — Ambulatory Visit: Payer: Medicare PPO | Attending: Cardiovascular Disease

## 2022-08-04 DIAGNOSIS — R0602 Shortness of breath: Secondary | ICD-10-CM | POA: Diagnosis not present

## 2022-08-04 LAB — ECHOCARDIOGRAM COMPLETE
AR max vel: 2.46 cm2
AV Area VTI: 2.44 cm2
AV Area mean vel: 2.42 cm2
AV Mean grad: 3 mmHg
AV Peak grad: 5.9 mmHg
Ao pk vel: 1.21 m/s
Area-P 1/2: 4.44 cm2
S' Lateral: 2.3 cm

## 2022-08-07 ENCOUNTER — Other Ambulatory Visit

## 2022-09-05 ENCOUNTER — Ambulatory Visit: Admitting: Cardiovascular Disease

## 2022-09-21 ENCOUNTER — Ambulatory Visit: Admitting: Cardiovascular Disease

## 2022-10-13 ENCOUNTER — Ambulatory Visit: Payer: Medicare PPO | Attending: Cardiovascular Disease | Admitting: Cardiology

## 2022-10-13 ENCOUNTER — Encounter: Payer: Self-pay | Admitting: Cardiology

## 2022-10-13 VITALS — BP 128/82 | HR 72 | Ht 59.0 in | Wt 228.8 lb

## 2022-10-13 DIAGNOSIS — I10 Essential (primary) hypertension: Secondary | ICD-10-CM

## 2022-10-13 DIAGNOSIS — R0602 Shortness of breath: Secondary | ICD-10-CM | POA: Diagnosis not present

## 2022-10-13 DIAGNOSIS — E785 Hyperlipidemia, unspecified: Secondary | ICD-10-CM

## 2022-10-13 DIAGNOSIS — R072 Precordial pain: Secondary | ICD-10-CM

## 2022-10-13 NOTE — Patient Instructions (Signed)
Medication Instructions:  No changes at this time.   *If you need a refill on your cardiac medications before your next appointment, please call your pharmacy*   Lab Work: None  If you have labs (blood work) drawn today and your tests are completely normal, you will receive your results only by: MyChart Message (if you have MyChart) OR A paper copy in the mail If you have any lab test that is abnormal or we need to change your treatment, we will call you to review the results.   Testing/Procedures: None   Follow-Up: At [facility], you and your health needs are our priority.  As part of our continuing mission to provide you with exceptional heart care, we have created designated Provider Care Teams.  These Care Teams include your primary Cardiologist (physician) and Advanced Practice Providers (APPs -  Physician Assistants and Nurse Practitioners) who all work together to provide you with the care you need, when you need it.  We recommend signing up for the patient portal called "MyChart".  Sign up information is provided on this After Visit Summary.  MyChart is used to connect with patients for Virtual Visits (Telemedicine).  Patients are able to view lab/test results, encounter notes, upcoming appointments, etc.  Non-urgent messages can be sent to your provider as well.   To learn more about what you can do with MyChart, go to [email or web address].    Your next appointment:   6 month(s)  Provider:   Lorine Bears, MD ONLY

## 2022-10-13 NOTE — Progress Notes (Signed)
Cardiology Office Note:  .   Date:  10/13/2022  ID:  Whitney Banks, DOB [birth year], MRN [id number] PCP: Leanna Sato, MD  [facility] Providers Cardiologist:  Lorine Bears, MD    History of Present Illness: .   Whitney Banks is a 72 y.o. female with a past medical history of essential hypertension, asthma, obesity, type 2 diabetes, hyperlipidemia, and pulmonary hypertension who is here today for follow-up.  In 2014 she underwent right knee replacement and presented shortly after with small bowel obstruction complicated by acute renal failure.  Troponins were mildly elevated.  Echocardiogram revealed normal LV systolic function with possible septal hypokinesis.  There was mild tricuspid regurgitation and moderate to severe pulmonary hypertension.  She had a Lexiscan Myoview that showed no evidence of ischemia with normal ejection fraction.  A follow-up echocardiogram was done which showed normal LV systolic function and pulmonary pressure.    She was last seen in clinic 07/07/2022 by Dr.Arida.  At that time she had reported worsening exertional dyspnea with no orthopnea, PND, or lower extremity edema.  In addition she reports some episodes of substernal chest discomfort described as tightness that can happen with rest or exertion.  She was scheduled for repeat echocardiogram and coronary CTA.  She returns to clinic today accompanied by family member.  She states that she has been doing well.  She denies any shortness of breath, chest pain, palpitations, or peripheral edema.  She recently underwent echocardiogram which revealed normal LVEF and only mild mitral regurgitation.  Previously she had been scheduled for coronary CTA but unfortunately that has not been completed she underwent Lexiscan MPI that was considered a low risk scan.  She denies any recent hospitalizations or visits to the emergency department.  Has been compliant with her medications without issues.  ROS: 10 point  review of systems has been completed and considered negative with exception of what is been listed in the HPI  Studies Reviewed: Marland Kitchen       TTE 08/04/22 1. Left ventricular ejection fraction, by estimation, is 60 to 65%. The  left ventricle has normal function. The left ventricle has no regional  wall motion abnormalities. Left ventricular diastolic parameters were  normal. The average left ventricular  global longitudinal strain is -18.3 %. The global longitudinal strain is  normal.   2. Right ventricular systolic function is normal. The right ventricular  size is normal.   3. The mitral valve is normal in structure. Mild mitral valve  regurgitation.   4. The aortic valve is tricuspid. Aortic valve regurgitation is not  visualized.   5. The inferior vena cava is normal in size with greater than 50%  respiratory variability, suggesting right atrial pressure of 3 mmHg.   Lexiscan MPI 07/27/22 Narrative & Impression  Pharmacological myocardial perfusion imaging study with no significant  ischemia Normal wall motion, EF estimated at 55% No EKG changes concerning for ischemia at peak stress or in recovery. CT attenuation correction images with mild aortic atherosclerosis, minimal coronary calcification noted Low risk scan   Risk Assessment/Calculations:             Physical Exam:   VS:  BP 128/82 (BP Location: Left Arm, Patient Position: Sitting, Cuff Size: Normal)   Pulse 72   Ht 4\' 11"  (1.499 m)   Wt 228 lb 12.8 oz (103.8 kg)   SpO2 96%   BMI 46.21 kg/m    Wt Readings from Last 3 Encounters:  10/13/22 228 lb  12.8 oz (103.8 kg)  07/11/22 225 lb 3.2 oz (102.2 kg)  07/07/22 224 lb 8 oz (101.8 kg)    GEN: Well nourished, well developed in no acute distress NECK: No JVD; No carotid bruits CARDIAC: RRR, no murmurs, rubs, gallops RESPIRATORY:  Clear to auscultation without rales, wheezing or rhonchi  ABDOMEN: Soft, non-tender, non-distended EXTREMITIES:  No edema; No deformity    ASSESSMENT AND PLAN: .   Essential hypertension with blood pressure today 128/82.  Blood pressures remained stable.  She is continued on losartan HCTZ 100/25 mg daily.  Encouraged to continue to monitor blood pressures 1 to 2 hours post medications.  Mild hyperlipidemia with an LDL over 114.  We are requesting her most recent labs from PCP.  Prior to initiating any new medications patient wants to request an appointment with her PCP and have more blood work drawn to determine her most accurate cholesterol and LDL.  Exertional dyspnea that is improved.  Recent echocardiogram revealed LVEF 60 to 65%, no regional wall motion abnormalities, and only mild mitral regurgitation.  Likely component of deconditioning.  Chest pain that patient states has resolved.  She recently underwent a Myoview stress testing which revealed a low risk scan with no significant ischemia.  Morbid obesity with BMI of 46.2.  This complicates her overall treatment.  She has been encouraged to increase her activity and decrease her caloric intake.        Dispo: Patient to return to clinic to see MD/APP in 5 to 6 months or sooner if needed to reevaluate symptoms.  Signed, Brittany Osier, NP

## 2022-11-02 ENCOUNTER — Other Ambulatory Visit: Payer: Self-pay | Admitting: Family Medicine

## 2022-11-02 DIAGNOSIS — Z1231 Encounter for screening mammogram for malignant neoplasm of breast: Secondary | ICD-10-CM

## 2022-11-13 ENCOUNTER — Encounter: Payer: Self-pay | Admitting: Oncology

## 2022-11-13 ENCOUNTER — Inpatient Hospital Stay: Payer: Medicare PPO | Attending: Oncology | Admitting: Oncology

## 2022-11-13 ENCOUNTER — Inpatient Hospital Stay: Payer: Medicare PPO

## 2022-11-13 VITALS — BP 141/83 | HR 81 | Temp 98.0°F | Resp 18 | Ht 59.0 in | Wt 232.0 lb

## 2022-11-13 DIAGNOSIS — Z6841 Body Mass Index (BMI) 40.0 and over, adult: Secondary | ICD-10-CM | POA: Insufficient documentation

## 2022-11-13 DIAGNOSIS — D509 Iron deficiency anemia, unspecified: Secondary | ICD-10-CM | POA: Diagnosis present

## 2022-11-13 DIAGNOSIS — E119 Type 2 diabetes mellitus without complications: Secondary | ICD-10-CM | POA: Insufficient documentation

## 2022-11-13 DIAGNOSIS — Z79899 Other long term (current) drug therapy: Secondary | ICD-10-CM | POA: Insufficient documentation

## 2022-11-13 DIAGNOSIS — K5900 Constipation, unspecified: Secondary | ICD-10-CM | POA: Diagnosis not present

## 2022-11-13 DIAGNOSIS — Z7985 Long-term (current) use of injectable non-insulin antidiabetic drugs: Secondary | ICD-10-CM | POA: Insufficient documentation

## 2022-11-13 DIAGNOSIS — Z803 Family history of malignant neoplasm of breast: Secondary | ICD-10-CM | POA: Insufficient documentation

## 2022-11-13 DIAGNOSIS — I10 Essential (primary) hypertension: Secondary | ICD-10-CM | POA: Insufficient documentation

## 2022-11-13 DIAGNOSIS — K219 Gastro-esophageal reflux disease without esophagitis: Secondary | ICD-10-CM | POA: Diagnosis not present

## 2022-11-13 LAB — FOLATE: Folate: 11.6 ng/mL (ref >5.9)

## 2022-11-13 LAB — IRON AND TIBC
Iron: 69 ug/dL (ref 28–170)
Saturation Ratios: 26 % (ref 10.4–31.8)
TIBC: 267 ug/dL (ref 250–450)
UIBC: 198 ug/dL

## 2022-11-13 LAB — RETICULOCYTES
Immature Retic Fract: 6.5 % (ref 2.3–15.9)
RBC.: 5.48 MIL/uL — ABNORMAL HIGH (ref 3.87–5.11)
Retic Count, Absolute: 67.4 10*3/uL (ref 19.0–186.0)
Retic Ct Pct: 1.2 % (ref 0.4–3.1)

## 2022-11-13 LAB — CBC (CANCER CENTER ONLY)
HCT: 35.2 % — ABNORMAL LOW (ref 36.0–46.0)
Hemoglobin: 10.6 g/dL — ABNORMAL LOW (ref 12.0–15.0)
MCH: 19.1 pg — ABNORMAL LOW (ref 26.0–34.0)
MCHC: 30.1 g/dL (ref 30.0–36.0)
MCV: 63.5 fL — ABNORMAL LOW (ref 80.0–100.0)
Platelet Count: 374 10*3/uL (ref 150–400)
RBC: 5.54 MIL/uL — ABNORMAL HIGH (ref 3.87–5.11)
RDW: 18.2 % — ABNORMAL HIGH (ref 11.5–15.5)
WBC Count: 9.4 10*3/uL (ref 4.0–10.5)
nRBC: 0 % (ref 0.0–0.2)

## 2022-11-13 LAB — DIRECT ANTIGLOBULIN TEST (NOT AT ARMC)
DAT, IgG: NEGATIVE
DAT, complement: NEGATIVE

## 2022-11-13 LAB — FERRITIN: Ferritin: 575 ng/mL — ABNORMAL HIGH (ref 11–307)

## 2022-11-13 LAB — LACTATE DEHYDROGENASE: LDH: 107 U/L (ref 98–192)

## 2022-11-13 NOTE — Progress Notes (Signed)
[facility]  Telephone:(336) [phone] Fax:(336) [phone]  ID: Whitney Banks OB: 1950/12/18  MR#: [id number]  ION#:[id number]  Patient Care Team: Leanna Sato, MD as PCP - General (Family Medicine) Iran Ouch, MD as PCP - Cardiology (Cardiology) Salena Saner, MD as Consulting Physician (Pulmonary Disease)  CHIEF COMPLAINT: Microcytic anemia.  INTERVAL HISTORY: Patient is a 72 year old female who was noted to have significantly reduced hemoglobin as well as a decreased MCV on routine blood work.  Patient reports she has a history of iron deficiency anemia and is currently taking oral iron supplementation.  Unfortunately, supplements caused her constipation.  She otherwise feels well.  There is no neurologic complaints.  She denies any recent fevers or illnesses.  She has a good appetite and denies weight loss.  She has no chest pain, shortness of breath, cough, or hemoptysis.  She denies any nausea, vomiting, constipation, or diarrhea.  She denies any melena or hematochezia.  She has no urinary complaints.  Patient offers no further specific complaints today.  REVIEW OF SYSTEMS:   ROS  As per HPI. Otherwise, a complete review of systems is negative.  PAST MEDICAL HISTORY: Past Medical History:  Diagnosis Date   Anemia    Arthritis    knees   Complication of anesthesia    Developed ileus after 2014 knee surgery   DM (diabetes mellitus) (HCC)    Gallstones    GERD (gastroesophageal reflux disease)    Hearing difficulty, right    HTN (hypertension)    Obesity, Class III, 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y) (HCC)    PONV (postoperative nausea and vomiting)    Stroke (HCC)    Discovered by MRI.  Caused Right hearing deficit   Wears dentures    full upper, partial lower   Wears hearing aid in both ears     PAST SURGICAL HISTORY: Past Surgical History:  Procedure Laterality Date   ABDOMINAL HYSTERECTOMY     CATARACT EXTRACTION W/PHACO Left 12/04/2019    Procedure: CATARACT EXTRACTION PHACO AND INTRAOCULAR LENS PLACEMENT (IOC) LEFT DIABETIC VISION  3.40 00:33.8  ;  Surgeon: Elliot Cousin, MD;  Location: [facility];  Service: Ophthalmology;  Laterality: Left;  Diabetic - oral meds   COLONOSCOPY WITH PROPOFOL N/A 05/07/2015   Procedure: COLONOSCOPY WITH PROPOFOL;  Surgeon: Wallace Cullens, MD;  Location: [facility];  Service: Gastroenterology;  Laterality: N/A;   COLONOSCOPY WITH PROPOFOL N/A 11/14/2018   Procedure: COLONOSCOPY WITH PROPOFOL;  Surgeon: Christena Deem, MD;  Location: [facility] ENDOSCOPY;  Service: Endoscopy;  Laterality: N/A;   ESOPHAGOGASTRODUODENOSCOPY (EGD) WITH PROPOFOL N/A 05/07/2015   Procedure: ESOPHAGOGASTRODUODENOSCOPY (EGD) WITH PROPOFOL;  Surgeon: Wallace Cullens, MD;  Location: [facility] ENDOSCOPY;  Service: Gastroenterology;  Laterality: N/A;   ESOPHAGOGASTRODUODENOSCOPY (EGD) WITH PROPOFOL N/A 11/14/2018   Procedure: ESOPHAGOGASTRODUODENOSCOPY (EGD) WITH PROPOFOL;  Surgeon: Christena Deem, MD;  Location: [facility]COPY;  Service: Endoscopy;  Laterality: N/A;   gallstones     JOINT REPLACEMENT     RTK   PARTIAL HYSTERECTOMY      FAMILY HISTORY: Family History  Problem Relation Age of Onset   Heart failure Mother    Heart attack Father    Breast cancer Sister 67   Breast cancer Sister     ADVANCED DIRECTIVES (Y/N):  N  HEALTH MAINTENANCE: Social History   Tobacco Use   Smoking status: Never   Smokeless tobacco: Never   Tobacco comments:    tried as teenager  Advertising account planner  Vaping status: Never Used  Substance Use Topics   Alcohol use: No   Drug use: No     Colonoscopy:  PAP:  Bone density:  Lipid panel:  Allergies  Allergen Reactions   Penicillins Hives   Lisinopril Cough    Current Outpatient Medications  Medication Sig Dispense Refill   albuterol (VENTOLIN HFA) 108 (90 Base) MCG/ACT inhaler Inhale into the lungs every 6 (six) hours as needed for wheezing or shortness of breath.     ascorbic  acid (VITAMIN C) 500 MG tablet Take 500 mg by mouth daily.     BYETTA 5 MCG PEN 5 MCG/0.02ML SOPN injection Inject 5 mcg into the skin 2 (two) times daily with a meal.     cetirizine (ZYRTEC) 10 MG tablet Take 10 mg by mouth daily.     Cyanocobalamin (VITAMIN B-12) 2500 MCG SUBL Take 1 tablet by mouth daily.     Docusate Calcium (STOOL SOFTENER PO) Take by mouth daily.     ferrous gluconate (FERGON) 325 MG tablet Take 325 mg by mouth daily with breakfast.     Fluticasone-Umeclidin-Vilant (TRELEGY ELLIPTA) 200-62.5-25 MCG/ACT AEPB Inhale 1 puff into the lungs daily. 60 each 11   losartan-hydrochlorothiazide (HYZAAR) 100-25 MG tablet Take 1 tablet by mouth daily.     pantoprazole (PROTONIX) 40 MG tablet Take 40 mg by mouth daily.     Potassium 99 MG TABS Take 1 tablet by mouth daily.     zinc gluconate 50 MG tablet Take 50 mg by mouth daily.     No current facility-administered medications for this visit.    OBJECTIVE: Vitals:   11/13/22 1438  BP: (!) 141/83  Pulse: 81  Resp: 18  Temp: 98 F (36.7 C)  SpO2: 100%     Body mass index is 46.86 kg/m.    ECOG FS:0 - Asymptomatic  General: Well-developed, well-nourished, no acute distress. Eyes: Pink conjunctiva, anicteric sclera. HEENT: Normocephalic, moist mucous membranes. Lungs: No audible wheezing or coughing. Heart: Regular rate and rhythm. Abdomen: Soft, nontender, no obvious distention. Musculoskeletal: No edema, cyanosis, or clubbing. Neuro: Alert, answering all questions appropriately. Cranial nerves grossly intact. Skin: No rashes or petechiae noted. Psych: Normal affect. Lymphatics: No cervical, calvicular, axillary or inguinal LAD.   LAB RESULTS:  Lab Results  Component Value Date   NA 138 07/07/2022   K 4.7 07/07/2022   CL 101 07/07/2022   CO2 27 07/07/2022   GLUCOSE 113 (H) 07/07/2022   BUN 29 (H) 07/07/2022   CREATININE 1.24 (H) 07/07/2022   CALCIUM 9.5 07/07/2022   PROT 7.5 09/11/2012   ALBUMIN 2.5 (L)  09/11/2012   AST 39 (H) 09/11/2012   ALT 48 09/11/2012   ALKPHOS 110 09/11/2012   BILITOT 1.5 (H) 09/11/2012   GFRNONAA 47 (L) 07/07/2022   GFRAA >60 09/13/2012    Lab Results  Component Value Date   WBC 9.4 11/13/2022   NEUTROABS 6.2 06/06/2022   HGB 10.6 (L) 11/13/2022   HCT 35.2 (L) 11/13/2022   MCV 63.5 (L) 11/13/2022   PLT 374 11/13/2022     STUDIES: No results found.  ASSESSMENT: Microcytic anemia.  PLAN:    Microcytic anemia: Patient noted to have a reduced hemoglobin of 10.6 as well as an MCV of 63.5.  All of her other laboratory work including iron stores as well as hemoglobinopathy panel are pending at time of dictation.  Patient does not require bone marrow biopsy.  Return to clinic in 3 weeks for further  evaluation and initiation of IV Venofer if needed.  I spent a total of 45 minutes reviewing chart data, face-to-face evaluation with the patient, counseling and coordination of care as detailed above.   Patient expressed understanding and was in agreement with this plan. She also understands that She can call clinic at any time with any questions, concerns, or complaints.    Jeralyn Ruths, MD   11/13/2022 4:50 PM

## 2022-11-14 ENCOUNTER — Ambulatory Visit
Admission: RE | Admit: 2022-11-14 | Discharge: 2022-11-14 | Disposition: A | Discharge status: Home / Self Care | Payer: Medicare PPO | Source: Ambulatory Visit | Attending: Family Medicine | Admitting: Family Medicine

## 2022-11-14 DIAGNOSIS — Z1231 Encounter for screening mammogram for malignant neoplasm of breast: Secondary | ICD-10-CM | POA: Insufficient documentation

## 2022-11-14 LAB — HAPTOGLOBIN: Haptoglobin: 224 mg/dL (ref 37–355)

## 2022-11-14 LAB — VITAMIN B12: Vitamin B-12: 3304 pg/mL — ABNORMAL HIGH (ref 180–914)

## 2022-11-14 LAB — ERYTHROPOIETIN: Erythropoietin: 9.9 m[IU]/mL (ref 2.6–18.5)

## 2022-11-15 LAB — PROTEIN ELECTROPHORESIS, SERUM
A/G Ratio: 1 (ref 0.7–1.7)
Albumin ELP: 3.7 g/dL (ref 2.9–4.4)
Alpha-1-Globulin: 0.3 g/dL (ref 0.0–0.4)
Alpha-2-Globulin: 0.8 g/dL (ref 0.4–1.0)
Beta Globulin: 1.1 g/dL (ref 0.7–1.3)
Gamma Globulin: 1.6 g/dL (ref 0.4–1.8)
Globulin, Total: 3.8 g/dL (ref 2.2–3.9)
Total Protein ELP: 7.5 g/dL (ref 6.0–8.5)

## 2022-11-15 LAB — HGB FRACTIONATION CASCADE
Hgb A2: 2.9 % (ref 1.8–3.2)
Hgb A: 97.1 % (ref 96.4–98.8)
Hgb F: 0 % (ref 0.0–2.0)
Hgb S: 0 %

## 2022-11-27 ENCOUNTER — Telehealth: Payer: Self-pay | Admitting: Pulmonary Disease

## 2022-11-27 NOTE — Telephone Encounter (Signed)
Patient is returning phone call. Patient phone number is [phone].

## 2022-11-27 NOTE — Telephone Encounter (Signed)
Called and spoke to patient. She stated that she received a voicemail from pulmonary but she was unable to understand the message fully.  I have reviewed patient's chart, and it appears that [facility] attempted to call patient to schedule an appt. I have provided patient with contact number found in phone note in care everywhere. She is aware that our office has not reached out to her.  Nothing further needed.

## 2022-12-04 ENCOUNTER — Inpatient Hospital Stay (HOSPITAL_BASED_OUTPATIENT_CLINIC_OR_DEPARTMENT_OTHER): Payer: Medicare PPO | Admitting: Oncology

## 2022-12-04 ENCOUNTER — Inpatient Hospital Stay: Payer: Medicare PPO

## 2022-12-04 ENCOUNTER — Ambulatory Visit: Payer: Medicare PPO

## 2022-12-04 ENCOUNTER — Encounter: Payer: Self-pay | Admitting: Oncology

## 2022-12-04 VITALS — BP 132/69 | HR 72 | Temp 97.7°F | Resp 18 | Ht 59.0 in | Wt 225.9 lb

## 2022-12-04 DIAGNOSIS — D509 Iron deficiency anemia, unspecified: Secondary | ICD-10-CM | POA: Diagnosis not present

## 2022-12-04 NOTE — Progress Notes (Signed)
[facility]  Telephone:(336) [phone] Fax:(336) [phone]  ID: Whitney Banks OB: 12-18-1950  MR#: [id number]  AOZ#:[id number]  Patient Care Team: Leanna Sato, MD as PCP - General (Family Medicine) Iran Ouch, MD as PCP - Cardiology (Cardiology) Salena Saner, MD as Consulting Physician (Pulmonary Disease)  CHIEF COMPLAINT: Microcytic anemia.  INTERVAL HISTORY: Patient returns to clinic today for further evaluation and discussion of her laboratory results.  She continues to feel well and remains asymptomatic.  She has no neurologic complaints.  She denies any recent fevers or illnesses.  She has a good appetite and denies weight loss.  She has no chest pain, shortness of breath, cough, or hemoptysis.  She denies any nausea, vomiting, constipation, or diarrhea.  She denies any melena or hematochezia.  She has no urinary complaints.  Patient offers no specific complaints today.  REVIEW OF SYSTEMS:   Review of Systems  Constitutional: Negative.  Negative for fever, malaise/fatigue and weight loss.  Respiratory: Negative.  Negative for cough, hemoptysis and shortness of breath.   Cardiovascular: Negative.  Negative for chest pain and leg swelling.  Gastrointestinal: Negative.  Negative for abdominal pain, blood in stool and melena.  Genitourinary: Negative.  Negative for frequency.  Musculoskeletal: Negative.  Negative for back pain.  Skin: Negative.  Negative for rash.  Neurological: Negative.  Negative for dizziness, focal weakness, weakness and headaches.  Psychiatric/Behavioral: Negative.  The patient is not nervous/anxious.     As per HPI. Otherwise, a complete review of systems is negative.  PAST MEDICAL HISTORY: Past Medical History:  Diagnosis Date   Anemia    Arthritis    knees   Complication of anesthesia    Developed ileus after 2014 knee surgery   DM (diabetes mellitus) (HCC)    Gallstones    GERD (gastroesophageal reflux disease)     Hearing difficulty, right    HTN (hypertension)    Obesity, Class III, 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y) (HCC)    PONV (postoperative nausea and vomiting)    Stroke (HCC)    Discovered by MRI.  Caused Right hearing deficit   Wears dentures    full upper, partial lower   Wears hearing aid in both ears     PAST SURGICAL HISTORY: Past Surgical History:  Procedure Laterality Date   ABDOMINAL HYSTERECTOMY     CATARACT EXTRACTION W/PHACO Left 12/04/2019   Procedure: CATARACT EXTRACTION PHACO AND INTRAOCULAR LENS PLACEMENT (IOC) LEFT DIABETIC VISION  3.40 00:33.8  ;  Surgeon: Elliot Cousin, MD;  Location: [facility];  Service: Ophthalmology;  Laterality: Left;  Diabetic - oral meds   COLONOSCOPY WITH PROPOFOL N/A 05/07/2015   Procedure: COLONOSCOPY WITH PROPOFOL;  Surgeon: Wallace Cullens, MD;  Location: [facility] ENDOSCOPY;  Service: Gastroenterology;  Laterality: N/A;   COLONOSCOPY WITH PROPOFOL N/A 11/14/2018   Procedure: COLONOSCOPY WITH PROPOFOL;  Surgeon: Christena Deem, MD;  Location: [facility] ENDOSCOPY;  Service: Endoscopy;  Laterality: N/A;   ESOPHAGOGASTRODUODENOSCOPY (EGD) WITH PROPOFOL N/A 05/07/2015   Procedure: ESOPHAGOGASTRODUODENOSCOPY (EGD) WITH PROPOFOL;  Surgeon: Wallace Cullens, MD;  Location: [facility] ENDOSCOPY;  Service: Gastroenterology;  Laterality: N/A;   ESOPHAGOGASTRODUODENOSCOPY (EGD) WITH PROPOFOL N/A 11/14/2018   Procedure: ESOPHAGOGASTRODUODENOSCOPY (EGD) WITH PROPOFOL;  Surgeon: Christena Deem, MD;  Location: [facility] ENDOSCOPY;  Service: Endoscopy;  Laterality: N/A;   gallstones     JOINT REPLACEMENT     RTK   PARTIAL HYSTERECTOMY      FAMILY HISTORY: Family History  Problem Relation Age  of Onset   Heart failure Mother    Heart attack Father    Breast cancer Sister 65   Breast cancer Sister     ADVANCED DIRECTIVES (Y/N):  N  HEALTH MAINTENANCE: Social History   Tobacco Use   Smoking status: Never   Smokeless tobacco: Never   Tobacco comments:    tried as teenager   Vaping Use   Vaping status: Never Used  Substance Use Topics   Alcohol use: No   Drug use: No     Colonoscopy:  PAP:  Bone density:  Lipid panel:  Allergies  Allergen Reactions   Penicillins Hives   Lisinopril Cough    Current Outpatient Medications  Medication Sig Dispense Refill   albuterol (VENTOLIN HFA) 108 (90 Base) MCG/ACT inhaler Inhale into the lungs every 6 (six) hours as needed for wheezing or shortness of breath.     ascorbic acid (VITAMIN C) 500 MG tablet Take 500 mg by mouth daily.     BYETTA 5 MCG PEN 5 MCG/0.02ML SOPN injection Inject 5 mcg into the skin 2 (two) times daily with a meal.     cetirizine (ZYRTEC) 10 MG tablet Take 10 mg by mouth daily.     Cyanocobalamin (VITAMIN B-12) 2500 MCG SUBL Take 1 tablet by mouth daily.     Docusate Calcium (STOOL SOFTENER PO) Take by mouth daily.     ferrous gluconate (FERGON) 325 MG tablet Take 325 mg by mouth daily with breakfast.     Fluticasone-Umeclidin-Vilant (TRELEGY ELLIPTA) 200-62.5-25 MCG/ACT AEPB Inhale 1 puff into the lungs daily. 60 each 11   losartan-hydrochlorothiazide (HYZAAR) 100-25 MG tablet Take 1 tablet by mouth daily.     pantoprazole (PROTONIX) 40 MG tablet Take 40 mg by mouth daily.     Potassium 99 MG TABS Take 1 tablet by mouth daily.     zinc gluconate 50 MG tablet Take 50 mg by mouth daily.     No current facility-administered medications for this visit.    OBJECTIVE: Vitals:   12/04/22 1313  BP: 132/69  Pulse: 72  Resp: 18  Temp: 97.7 F (36.5 C)  SpO2: 100%     Body mass index is 45.63 kg/m.    ECOG FS:0 - Asymptomatic  General: Well-developed, well-nourished, no acute distress. Eyes: Pink conjunctiva, anicteric sclera. HEENT: Normocephalic, moist mucous membranes. Lungs: No audible wheezing or coughing. Heart: Regular rate and rhythm. Abdomen: Soft, nontender, no obvious distention. Musculoskeletal: No edema, cyanosis, or clubbing. Neuro: Alert, answering all questions  appropriately. Cranial nerves grossly intact. Skin: No rashes or petechiae noted. Psych: Normal affect.  LAB RESULTS:  Lab Results  Component Value Date   NA 138 07/07/2022   K 4.7 07/07/2022   CL 101 07/07/2022   CO2 27 07/07/2022   GLUCOSE 113 (H) 07/07/2022   BUN 29 (H) 07/07/2022   CREATININE 1.24 (H) 07/07/2022   CALCIUM 9.5 07/07/2022   PROT 7.5 09/11/2012   ALBUMIN 2.5 (L) 09/11/2012   AST 39 (H) 09/11/2012   ALT 48 09/11/2012   ALKPHOS 110 09/11/2012   BILITOT 1.5 (H) 09/11/2012   GFRNONAA 47 (L) 07/07/2022   GFRAA >60 09/13/2012    Lab Results  Component Value Date   WBC 9.4 11/13/2022   NEUTROABS 6.2 06/06/2022   HGB 10.6 (L) 11/13/2022   HCT 35.2 (L) 11/13/2022   MCV 63.5 (L) 11/13/2022   PLT 374 11/13/2022     STUDIES: MM 3D SCREENING MAMMOGRAM BILATERAL BREAST  Result Date: 11/15/2022  CLINICAL DATA:  Screening. EXAM: DIGITAL SCREENING BILATERAL MAMMOGRAM WITH TOMOSYNTHESIS AND CAD TECHNIQUE: Bilateral screening digital craniocaudal and mediolateral oblique mammograms were obtained. Bilateral screening digital breast tomosynthesis was performed. The images were evaluated with computer-aided detection. COMPARISON:  Previous exam(s). ACR Breast Density Category b: There are scattered areas of fibroglandular density. FINDINGS: There are no findings suspicious for malignancy. IMPRESSION: No mammographic evidence of malignancy. A result letter of this screening mammogram will be mailed directly to the patient. RECOMMENDATION: Screening mammogram in one year. (Code:SM-B-01Y) BI-RADS CATEGORY  1: Negative. Electronically Signed   By: Harmon Pier M.D.   On: 11/15/2022 17:28    ASSESSMENT: Microcytic anemia.  PLAN:    Microcytic anemia: Patient noted to have a reduced hemoglobin of 10.6 as well as an MCV of 63.5.  All of her other laboratory work including iron stores as well as hemoglobinopathy panel are either negative or within normal limits.  Additional testing  has been sent to evaluate for underlying alpha thalassemia. Patient does not require bone marrow biopsy.  No intervention is needed at this time.  Patient does not require IV Venofer.  She will have video-assisted telemedicine visit in 3 weeks to discuss the results.  I spent a total of 20 minutes reviewing chart data, face-to-face evaluation with the patient, counseling and coordination of care as detailed above.  Patient expressed understanding and was in agreement with this plan. She also understands that She can call clinic at any time with any questions, concerns, or complaints.    Jeralyn Ruths, MD   12/04/2022 4:22 PM

## 2022-12-15 LAB — ALPHA-THALASSEMIA GENOTYPR

## 2022-12-25 ENCOUNTER — Inpatient Hospital Stay: Payer: Medicare PPO | Attending: Oncology | Admitting: Oncology

## 2022-12-25 DIAGNOSIS — D563 Thalassemia minor: Secondary | ICD-10-CM | POA: Diagnosis not present

## 2022-12-25 NOTE — Progress Notes (Unsigned)
[facility]  [phone] [phone]  ID: [name] OB: June 27, 1950  MR#: [id number]  AOZ#:[id number]  Patient Care Team: Leanna Sato, MD as PCP - General (Family Medicine) Iran Ouch, MD as PCP - Cardiology (Cardiology) Salena Saner, MD as Consulting Physician (Pulmonary Disease)  CHIEF COMPLAINT: Microcytic anemia.  INTERVAL HISTORY: Patient returns to clinic today for further evaluation and discussion of her laboratory results.  She continues to feel well and remains asymptomatic.  She has no neurologic complaints.  She denies any recent fevers or illnesses.  She has a good appetite and denies weight loss.  She has no chest pain, shortness of breath, cough, or hemoptysis.  She denies any nausea, vomiting, constipation, or diarrhea.  She denies any melena or hematochezia.  She has no urinary complaints.  Patient offers no specific complaints today.  REVIEW OF SYSTEMS:   Review of Systems  Constitutional: Negative.  Negative for fever, malaise/fatigue and weight loss.  Respiratory: Negative.  Negative for cough, hemoptysis and shortness of breath.   Cardiovascular: Negative.  Negative for chest pain and leg swelling.  Gastrointestinal: Negative.  Negative for abdominal pain, blood in stool and melena.  Genitourinary: Negative.  Negative for frequency.  Musculoskeletal: Negative.  Negative for back pain.  Skin: Negative.  Negative for rash.  Neurological: Negative.  Negative for dizziness, focal weakness, weakness and headaches.  Psychiatric/Behavioral: Negative.  The patient is not nervous/anxious.     As per HPI. Otherwise, a complete review of systems is negative.  PAST MEDICAL HISTORY: Past Medical History:  Diagnosis Date  . Anemia   . Arthritis    knees  . Complication of anesthesia    Developed ileus after 2014 knee surgery  . DM (diabetes mellitus) (HCC)   . Gallstones   . GERD (gastroesophageal reflux  disease)   . Hearing difficulty, right   . HTN (hypertension)   . Obesity, Class III, 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y) (HCC)   . PONV (postoperative nausea and vomiting)   . Stroke [facility])    Discovered by MRI.  Caused Right hearing deficit  . Wears dentures    full upper, partial lower  . Wears hearing aid in both ears     PAST SURGICAL HISTORY: Past Surgical History:  Procedure Laterality Date  . ABDOMINAL HYSTERECTOMY    . CATARACT EXTRACTION W/PHACO Left 12/04/2019   Procedure: CATARACT EXTRACTION PHACO AND INTRAOCULAR LENS PLACEMENT (IOC) LEFT DIABETIC VISION  3.40 00:33.8  ;  Surgeon: Elliot Cousin, MD;  Location: [facility];  Service: Ophthalmology;  Laterality: Left;  Diabetic - oral meds  . COLONOSCOPY WITH PROPOFOL N/A 05/07/2015   Procedure: COLONOSCOPY WITH PROPOFOL;  Surgeon: Wallace Cullens, MD;  Location: [facility] ENDOSCOPY;  Service: Gastroenterology;  Laterality: N/A;  . COLONOSCOPY WITH PROPOFOL N/A 11/14/2018   Procedure: COLONOSCOPY WITH PROPOFOL;  Surgeon: Christena Deem, MD;  Location: [facility] ENDOSCOPY;  Service: Endoscopy;  Laterality: N/A;  . ESOPHAGOGASTRODUODENOSCOPY (EGD) WITH PROPOFOL N/A 05/07/2015   Procedure: ESOPHAGOGASTRODUODENOSCOPY (EGD) WITH PROPOFOL;  Surgeon: Wallace Cullens, MD;  Location: [facility] ENDOSCOPY;  Service: Gastroenterology;  Laterality: N/A;  . ESOPHAGOGASTRODUODENOSCOPY (EGD) WITH PROPOFOL N/A 11/14/2018   Procedure: ESOPHAGOGASTRODUODENOSCOPY (EGD) WITH PROPOFOL;  Surgeon: Christena Deem, MD;  Location: [facility] ENDOSCOPY;  Service: Endoscopy;  Laterality: N/A;  . gallstones    . JOINT REPLACEMENT     RTK  . PARTIAL HYSTERECTOMY      FAMILY HISTORY: Family History  Problem Relation Age  of Onset  . Heart failure Mother   . Heart attack Father   . Breast cancer Sister 65  . Breast cancer Sister     ADVANCED DIRECTIVES (Y/N):  N  HEALTH MAINTENANCE: Social History   Tobacco Use  . Smoking status: Never  . Smokeless tobacco: Never  .  Tobacco comments:    tried as teenager  Vaping Use  . Vaping status: Never Used  Substance Use Topics  . Alcohol use: No  . Drug use: No     Colonoscopy:  PAP:  Bone density:  Lipid panel:  Allergies  Allergen Reactions  . Penicillins Hives  . Lisinopril Cough    Current Outpatient Medications  Medication Sig Dispense Refill  . albuterol (VENTOLIN HFA) 108 (90 Base) MCG/ACT inhaler Inhale into the lungs every 6 (six) hours as needed for wheezing or shortness of breath.    Marland Kitchen ascorbic acid (VITAMIN C) 500 MG tablet Take 500 mg by mouth daily.    Marland Kitchen BYETTA 5 MCG PEN 5 MCG/0.02ML SOPN injection Inject 5 mcg into the skin 2 (two) times daily with a meal.    . cetirizine (ZYRTEC) 10 MG tablet Take 10 mg by mouth daily.    . Cyanocobalamin (VITAMIN B-12) 2500 MCG SUBL Take 1 tablet by mouth daily.    Tery Sanfilippo Calcium (STOOL SOFTENER PO) Take by mouth daily.    . ferrous gluconate (FERGON) 325 MG tablet Take 325 mg by mouth daily with breakfast.    . Fluticasone-Umeclidin-Vilant (TRELEGY ELLIPTA) 200-62.5-25 MCG/ACT AEPB Inhale 1 puff into the lungs daily. 60 each 11  . losartan-hydrochlorothiazide (HYZAAR) 100-25 MG tablet Take 1 tablet by mouth daily.    . pantoprazole (PROTONIX) 40 MG tablet Take 40 mg by mouth daily.    . Potassium 99 MG TABS Take 1 tablet by mouth daily.    Marland Kitchen zinc gluconate 50 MG tablet Take 50 mg by mouth daily.     No current facility-administered medications for this visit.    OBJECTIVE: There were no vitals filed for this visit.    There is no height or weight on file to calculate BMI.    ECOG FS:0 - Asymptomatic  General: Well-developed, well-nourished, no acute distress. Eyes: Pink conjunctiva, anicteric sclera. HEENT: Normocephalic, moist mucous membranes. Lungs: No audible wheezing or coughing. Heart: Regular rate and rhythm. Abdomen: Soft, nontender, no obvious distention. Musculoskeletal: No edema, cyanosis, or clubbing. Neuro: Alert,  answering all questions appropriately. Cranial nerves grossly intact. Skin: No rashes or petechiae noted. Psych: Normal affect.  LAB RESULTS:  Lab Results  Component Value Date   NA 138 07/07/2022   K 4.7 07/07/2022   CL 101 07/07/2022   CO2 27 07/07/2022   GLUCOSE 113 (H) 07/07/2022   BUN 29 (H) 07/07/2022   CREATININE 1.24 (H) 07/07/2022   CALCIUM 9.5 07/07/2022   PROT 7.5 09/11/2012   ALBUMIN 2.5 (L) 09/11/2012   AST 39 (H) 09/11/2012   ALT 48 09/11/2012   ALKPHOS 110 09/11/2012   BILITOT 1.5 (H) 09/11/2012   GFRNONAA 47 (L) 07/07/2022   GFRAA >60 09/13/2012    Lab Results  Component Value Date   WBC 9.4 11/13/2022   NEUTROABS 6.2 06/06/2022   HGB 10.6 (L) 11/13/2022   HCT 35.2 (L) 11/13/2022   MCV 63.5 (L) 11/13/2022   PLT 374 11/13/2022     STUDIES: No results found.  ASSESSMENT: Microcytic anemia.  PLAN:    Microcytic anemia: Patient noted to have a reduced hemoglobin  of 10.6 as well as an MCV of 63.5.  All of her other laboratory work including iron stores as well as hemoglobinopathy panel are either negative or within normal limits.  Additional testing has been sent to evaluate for underlying alpha thalassemia. Patient does not require bone marrow biopsy.  No intervention is needed at this time.  Patient does not require IV Venofer.  She will have video-assisted telemedicine visit in 3 weeks to discuss the results.  I spent a total of 20 minutes reviewing chart data, face-to-face evaluation with the patient, counseling and coordination of care as detailed above.  Patient expressed understanding and was in agreement with this plan. She also understands that She can call clinic at any time with any questions, concerns, or complaints.    Jeralyn Ruths, MD   12/25/2022 2:10 PM

## 2022-12-26 DIAGNOSIS — D563 Thalassemia minor: Secondary | ICD-10-CM | POA: Insufficient documentation

## 2023-01-08 ENCOUNTER — Ambulatory Visit (INDEPENDENT_AMBULATORY_CARE_PROVIDER_SITE_OTHER): Payer: Medicare PPO | Admitting: Pulmonary Disease

## 2023-01-08 ENCOUNTER — Encounter: Payer: Self-pay | Admitting: Pulmonary Disease

## 2023-01-08 VITALS — BP 118/82 | HR 67 | Temp 97.7°F | Ht 59.0 in | Wt 228.4 lb

## 2023-01-08 DIAGNOSIS — J4540 Moderate persistent asthma, uncomplicated: Secondary | ICD-10-CM

## 2023-01-08 DIAGNOSIS — R059 Cough, unspecified: Secondary | ICD-10-CM

## 2023-01-08 DIAGNOSIS — Z6841 Body Mass Index (BMI) 40.0 and over, adult: Secondary | ICD-10-CM

## 2023-01-08 DIAGNOSIS — R0602 Shortness of breath: Secondary | ICD-10-CM | POA: Diagnosis not present

## 2023-01-08 LAB — NITRIC OXIDE: Nitric Oxide: 8

## 2023-01-08 NOTE — Progress Notes (Signed)
Subjective:    Patient ID: [name], female    DOB: 1950/09/25, 72 y.o.   MRN: [id number]  Patient Care Team: Leanna Sato, MD as PCP - General (Family Medicine) Iran Ouch, MD as PCP - Cardiology (Cardiology) Salena Saner, MD as Consulting Physician (Pulmonary Disease)  Chief Complaint  Patient presents with   Follow-up    DOE. No wheezing. Dry cough at night for 2 nights.    BACKGROUND/INTERVAL:Cairo is a 72 year old lifelong never smoker who presents for follow-up on wheezing and shortness of breath she was initially seen 06 June 2022 with follow-up on 11 Jul 2022.  She was noted to have an elevated nitric oxide at 65 ppb on initial consistent with type II inflammation.  She has been diagnosed with moderate asthma and is on Trelegy Ellipta 200.  Since her last visit she has done well however over the last 2 nights she has noted increasing reductive cough.  Is a scheduled follow-up visit.  HPI Discussed the use of AI scribe software for clinical note transcription with the patient, who gave verbal consent to proceed.  History of Present Illness   The patient, a 72 year old with moderate persistent asthma, reports an overall good health status. She notes that shortness of breath only occurs with significant physical exertion, particularly uphill walking. The patient denies any recent episodes of wheezing. However, she reports a return of nocturnal coughing over the past two nights. She denies any associated reflux or heartburn but does report postnasal drip. The patient is currently taking Zyrtec during the day for allergy management.  The patient's asthma control appears to have improved significantly with Trelegy, as evidenced by a reduction in airway inflammation levels from a high of 65 to a normal level of 8. She has not required the use of a rescue inhaler recently. The patient denies any recent fevers, chills, or sweats, and also denies any leg swelling. She  has received her flu shot.   Overall, she feels well and looks well.    DATA 06/06/2022 nitric oxide: 65 ppb 06/06/2022 allergen panel/IgE/CBC: Identified particularly dust mites, grasses,trees, ragweed.  IgE 1348.  Of eosinophilia (600 cells). 06/30/2022 PFTs: FEV1 1.59 L or 86% predicted FVC 1.77 L or 72% predicted FEV1/FVC 90%, lung with the exception of ERV decreased systems with obesity.  Diffusion capacity normal.  Overall normal PFTs.   Review of Systems A 10 point review of systems was performed and it is as noted above otherwise negative.   Patient Active Problem List   Diagnosis Date Noted   Alpha thalassemia silent carrier 12/26/2022   Moderate persistent asthma without complication 07/11/2022   Dyspnea 10/08/2012   Essential hypertension 10/08/2012   Pulmonary hypertension (HCC) 10/08/2012    Social History   Tobacco Use   Smoking status: Never   Smokeless tobacco: Never   Tobacco comments:    tried as teenager  Substance Use Topics   Alcohol use: No    Allergies  Allergen Reactions   Penicillins Hives   Lisinopril Cough    Current Meds  Medication Sig   albuterol (VENTOLIN HFA) 108 (90 Base) MCG/ACT inhaler Inhale into the lungs every 6 (six) hours as needed for wheezing or shortness of breath.   ascorbic acid (VITAMIN C) 500 MG tablet Take 500 mg by mouth daily.   BYETTA 10 MCG PEN 10 MCG/0.04ML SOPN injection Inject 10 mcg into the skin 2 (two) times daily with a meal.   cetirizine (ZYRTEC) 10  MG tablet Take 10 mg by mouth daily.   Cyanocobalamin (VITAMIN B-12) 2500 MCG SUBL Take 1 tablet by mouth daily.   Docusate Calcium (STOOL SOFTENER PO) Take by mouth daily.   ferrous gluconate (FERGON) 325 MG tablet Take 325 mg by mouth daily with breakfast.   Fluticasone-Umeclidin-Vilant (TRELEGY ELLIPTA) 200-62.5-25 MCG/ACT AEPB Inhale 1 puff into the lungs daily.   losartan-hydrochlorothiazide (HYZAAR) 100-25 MG tablet Take 1 tablet by mouth daily.    pantoprazole (PROTONIX) 40 MG tablet Take 40 mg by mouth daily.   Potassium 99 MG TABS Take 1 tablet by mouth daily.   zinc gluconate 50 MG tablet Take 50 mg by mouth daily.    Immunization History  Administered Date(s) Administered   Fluad Quad(high Dose 65+) 11/03/2022   Fluzone Influenza virus vaccine,trivalent (IIV3), split virus 12/17/2012   Influenza,inj,Quad PF,6+ Mos 01/20/2014, 12/17/2015, 12/01/2016   Influenza,inj,quad, With Preservative 03/23/2015   Influenza-Unspecified 12/25/2017   Moderna Sars-Covid-2 Vaccination 04/08/2019, 05/07/2019, 01/14/2020, 08/18/2020   Pneumococcal Conjugate-13 10/31/2018   Td (Adult),5 Lf Tetanus Toxid, Preservative Free 04/13/2005   Tdap 03/23/2015        Objective:     BP 118/82 (BP Location: Right Wrist, Cuff Size: Normal)   Pulse 67   Temp 97.7 F (36.5 C)   Ht 4\' 11"  (1.499 m)   Wt 228 lb 6.4 oz (103.6 kg)   SpO2 99%   BMI 46.13 kg/m   SpO2: 99 % O2 Device: None (Room air)  GENERAL: Obese woman, no acute distress, fully ambulatory, no conversational dyspnea. HEAD: Normocephalic, atraumatic.  EYES: Pupils equal, round, reactive to light.  No scleral icterus.  MOUTH: Dentition intact, oral mucosa moist.  No thrush. NECK: Supple. No thyromegaly. Trachea midline. No JVD.  No adenopathy. PULMONARY: Good air entry bilaterally.  No adventitious sounds. CARDIOVASCULAR: S1 and S2. Regular rate and rhythm.  No rubs, murmurs or gallops heard. ABDOMEN: Obese, otherwise benign. MUSCULOSKELETAL: No joint deformity, no clubbing, no edema.  NEUROLOGIC: No overt focal deficit, no gait disturbance, speech is fluent. SKIN: Intact,warm,dry. PSYCH: Mood and behavior normal.  Lab Results  Component Value Date   NITRICOXIDE 8 01/08/2023  *65>>14>>8  Assessment & Plan:     ICD-10-CM   1. Moderate persistent asthma without complication  J45.40 Nitric oxide   Eosinophilic/allergic type    2. SOB (shortness of breath)  R06.02    Mostly  on inclines/stairs Obesity/deconditioning likely cause    3. Cough, unspecified type  R05.9    Likely related to allergies      Orders Placed This Encounter  Procedures   Nitric oxide   Discussion:    Moderate Persistent Asthma Shortness of breath with exertion, no wheezing. Nitric oxide level decreased from 65 to 8 indicating good control with Trelegy. No need for rescue inhaler lately. -Continue Trelegy. -Next follow-up in 6 months.  Nighttime Cough Possible allergic etiology. No fever, chills, or sweats. No reflux symptoms. -Continue Zyrtec, consider taking it an hour before bedtime. -Contact office if cough persists.  General Health Maintenance -Flu vaccination confirmed.     Will see the patient in follow-up in 6 months time she should contact us prior to that time should any new difficulties arise.   Gailen Shelter, MD Advanced Bronchoscopy PCCM [facility]    *This note was generated using voice recognition software/Dragon and/or AI transcription program.  Despite best efforts to proofread, errors can occur which can change the meaning. Any transcriptional errors that result from this process  are unintentional and may not be fully corrected at the time of dictation.

## 2023-01-08 NOTE — Patient Instructions (Signed)
VISIT SUMMARY:  Today, we reviewed your asthma management and overall health. Your asthma appears to be well-controlled with Trelegy, and you have not needed your rescue inhaler. We also discussed your recent nighttime cough and possible causes.  YOUR PLAN:  -MODERATE PERSISTENT ASTHMA: Moderate persistent asthma is a type of asthma that requires daily medication to control symptoms and prevent flare-ups. Your asthma is well-controlled with Trelegy, as shown by your reduced airway inflammation levels. Continue taking Trelegy as prescribed, and we will follow up in 6 months.  -NIGHTTIME COUGH: Your nighttime cough may be due to allergies. Since you do not have symptoms like fever or reflux, continue taking Zyrtec and consider taking it an hour before bedtime. If the cough continues, please contact our office.  -GENERAL HEALTH MAINTENANCE: You have received your flu vaccination, which is important for preventing the flu and maintaining overall health.  INSTRUCTIONS:  Please continue taking Trelegy for your asthma and Zyrtec for your allergies. Consider taking Zyrtec an hour before bedtime to help with your nighttime cough. If your cough persists, contact our office. We will schedule your next follow-up appointment in 6 months.

## 2023-06-07 ENCOUNTER — Telehealth: Payer: Self-pay | Admitting: *Deleted

## 2023-06-07 ENCOUNTER — Other Ambulatory Visit: Payer: Self-pay

## 2023-06-07 ENCOUNTER — Encounter
Admission: RE | Admit: 2023-06-07 | Discharge: 2023-06-07 | Disposition: A | Discharge status: Home / Self Care | Source: Ambulatory Visit | Attending: Orthopedic Surgery | Admitting: Orthopedic Surgery

## 2023-06-07 VITALS — BP 129/73 | HR 86 | Temp 97.3°F | Resp 15 | Ht 59.0 in | Wt 218.0 lb

## 2023-06-07 DIAGNOSIS — R0600 Dyspnea, unspecified: Secondary | ICD-10-CM | POA: Diagnosis not present

## 2023-06-07 DIAGNOSIS — I272 Pulmonary hypertension, unspecified: Secondary | ICD-10-CM | POA: Insufficient documentation

## 2023-06-07 DIAGNOSIS — E119 Type 2 diabetes mellitus without complications: Secondary | ICD-10-CM | POA: Diagnosis not present

## 2023-06-07 DIAGNOSIS — D563 Thalassemia minor: Secondary | ICD-10-CM | POA: Insufficient documentation

## 2023-06-07 DIAGNOSIS — M1712 Unilateral primary osteoarthritis, left knee: Secondary | ICD-10-CM | POA: Diagnosis not present

## 2023-06-07 DIAGNOSIS — Z01812 Encounter for preprocedural laboratory examination: Secondary | ICD-10-CM | POA: Insufficient documentation

## 2023-06-07 DIAGNOSIS — Z01818 Encounter for other preprocedural examination: Secondary | ICD-10-CM | POA: Diagnosis present

## 2023-06-07 DIAGNOSIS — Z880 Allergy status to penicillin: Secondary | ICD-10-CM | POA: Diagnosis not present

## 2023-06-07 HISTORY — DX: Bilateral primary osteoarthritis of knee: M17.0

## 2023-06-07 HISTORY — DX: Atherosclerotic heart disease of native coronary artery without angina pectoris: I25.10

## 2023-06-07 HISTORY — DX: Pulmonary hypertension, unspecified: I27.20

## 2023-06-07 HISTORY — DX: Unspecified chronic gastritis without bleeding: K29.50

## 2023-06-07 HISTORY — DX: Moderate persistent asthma, uncomplicated: J45.40

## 2023-06-07 HISTORY — DX: Nontraumatic intracerebral hemorrhage, unspecified: I61.9

## 2023-06-07 HISTORY — DX: Essential (primary) hypertension: I10

## 2023-06-07 HISTORY — DX: Type 2 diabetes mellitus without complications: E11.9

## 2023-06-07 HISTORY — DX: Pneumonia, unspecified organism: J18.9

## 2023-06-07 HISTORY — DX: Hyperlipidemia, unspecified: E78.5

## 2023-06-07 HISTORY — DX: Atherosclerosis of aorta: I70.0

## 2023-06-07 HISTORY — DX: Thalassemia minor: D56.3

## 2023-06-07 LAB — COMPREHENSIVE METABOLIC PANEL WITH GFR
ALT: 15 U/L (ref 0–44)
AST: 14 U/L — ABNORMAL LOW (ref 15–41)
Albumin: 3.9 g/dL (ref 3.5–5.0)
Alkaline Phosphatase: 82 U/L (ref 38–126)
Anion gap: 10 (ref 5–15)
BUN: 20 mg/dL (ref 8–23)
CO2: 27 mmol/L (ref 22–32)
Calcium: 9.4 mg/dL (ref 8.9–10.3)
Chloride: 97 mmol/L — ABNORMAL LOW (ref 98–111)
Creatinine, Ser: 1.05 mg/dL — ABNORMAL HIGH (ref 0.44–1.00)
GFR, Estimated: 57 mL/min — ABNORMAL LOW (ref >60)
Glucose, Bld: 91 mg/dL (ref 70–99)
Potassium: 3.9 mmol/L (ref 3.5–5.1)
Sodium: 134 mmol/L — ABNORMAL LOW (ref 135–145)
Total Bilirubin: 0.6 mg/dL (ref 0.0–1.2)
Total Protein: 8 g/dL (ref 6.5–8.1)

## 2023-06-07 LAB — CBC
HCT: 34 % — ABNORMAL LOW (ref 36.0–46.0)
Hemoglobin: 10.6 g/dL — ABNORMAL LOW (ref 12.0–15.0)
MCH: 19.5 pg — ABNORMAL LOW (ref 26.0–34.0)
MCHC: 31.2 g/dL (ref 30.0–36.0)
MCV: 62.5 fL — ABNORMAL LOW (ref 80.0–100.0)
Platelets: 346 10*3/uL (ref 150–400)
RBC: 5.44 MIL/uL — ABNORMAL HIGH (ref 3.87–5.11)
RDW: 18.2 % — ABNORMAL HIGH (ref 11.5–15.5)
WBC: 8.1 10*3/uL (ref 4.0–10.5)
nRBC: 0 % (ref 0.0–0.2)

## 2023-06-07 LAB — SURGICAL PCR SCREEN
MRSA, PCR: NEGATIVE
Staphylococcus aureus: NEGATIVE

## 2023-06-07 LAB — URINALYSIS, ROUTINE W REFLEX MICROSCOPIC
Bilirubin Urine: NEGATIVE
Glucose, UA: NEGATIVE mg/dL
Hgb urine dipstick: NEGATIVE
Ketones, ur: NEGATIVE mg/dL
Nitrite: NEGATIVE
Protein, ur: NEGATIVE mg/dL
Specific Gravity, Urine: 1.006 (ref 1.005–1.030)
pH: 6 (ref 5.0–8.0)

## 2023-06-07 LAB — C-REACTIVE PROTEIN: CRP: 1.9 mg/dL — ABNORMAL HIGH (ref <1.0)

## 2023-06-07 LAB — SEDIMENTATION RATE: Sed Rate: 46 mm/h — ABNORMAL HIGH (ref 0–30)

## 2023-06-07 LAB — HEMOGLOBIN A1C
Hgb A1c MFr Bld: 5.6 % (ref 4.8–5.6)
Mean Plasma Glucose: 114.02 mg/dL

## 2023-06-07 NOTE — Telephone Encounter (Signed)
-----   Message from Verlee Monte sent at 06/07/2023 12:27 PM EDT ----- Regarding: Request for pre-operative cardiac clearance Verlee Monte, NP  P Cv Div Preop Callback Request for pre-operative cardiac clearance:   1. What type of surgery is being performed? ARTHROPLASTY, KNEE, TOTAL, USING IMAGELESS COMPUTER-ASSISTED NAVIGATION  2. When is this surgery scheduled? 06/18/2023   3. Type of clearance being requested (medical, pharmacy, both)? MEDICAL   4. Are there any medications that need to be held prior to surgery? NONE  5. Practice name and name of physician performing surgery? Performing surgeon: Dr. Francesco Sor, MD Requesting clearance: Quentin Mulling, FNP-C     6. Anesthesia type (none, local, MAC, general)? GENERAL  7. What is the office phone and fax number?   Phone: [phone] Fax: [phone]  ATTENTION: Unable to create telephone message as per your standard workflow. Directed by [facility] providers to send requests for cardiac clearance to this pool for appropriate distribution to provider covering pre-operative clearances.  Quentin Mulling, MSN, APRN, FNP-C, CEN [facility] Peri-operative Services Nurse Practitioner Phone: [phone] 06/07/23 12:21 PM

## 2023-06-07 NOTE — Patient Instructions (Addendum)
 Your procedure is scheduled on: Monday, April 14 Report to the Registration Desk on the 1st floor of the [facility] To find out your arrival time, please call [phone] between 1PM - 3PM on: Friday, April 11 If your arrival time is 6:00 am, do not arrive before that time as the [facility] entrance doors do not open until 6:00 am.  REMEMBER: Instructions that are not followed completely may result in serious medical risk, up to and including death; or upon the discretion of your surgeon and anesthesiologist your surgery may need to be rescheduled.  Do not eat food after midnight the night before surgery.  No gum chewing or hard candies.  You may however, drink water up to 2 hours before you are scheduled to arrive for your surgery. Do not drink anything within 2 hours of your scheduled arrival time.  In addition, your doctor has ordered for you to drink the provided:  Gatorade G2 Drinking this carbohydrate drink up to two hours before surgery helps to reduce insulin resistance and improve patient outcomes. Please complete drinking 2 hours before scheduled arrival time.  One week prior to surgery: starting April 7 Stop Anti-inflammatories (NSAIDS) such as Advil, Aleve, Ibuprofen, Motrin, Naproxen, Naprosyn and Aspirin based products such as Excedrin, Goody's Powder, BC Powder. Stop ANY OVER THE COUNTER supplements until after surgery. Stop vitamin C, vitamin D, cod liver oil, vitamin B, zinc, sea moss, vitamin A.  You may however, continue to take Tylenol if needed for pain up until the day of surgery.  BYETTA injection - hold for 7 days before surgery. Last day to TAKE is Sunday, April 6. Resume AFTER surgery.  Continue taking all of your other prescription medications up until the day of surgery.  ON THE DAY OF SURGERY ONLY TAKE THESE MEDICATIONS WITH SIPS OF WATER:  pantoprazole (PROTONIX)  Trelegy inhaler albuterol (VENTOLIN HFA) 108 (90 Base) MCG/ACT inhaler -  bring to  the hospital  No Alcohol for 24 hours before or after surgery.  No Smoking including e-cigarettes for 24 hours before surgery.  No chewable tobacco products for at least 6 hours before surgery.  No nicotine patches on the day of surgery.  Do not use any "recreational" drugs for at least a week (preferably 2 weeks) before your surgery.  Please be advised that the combination of cocaine and anesthesia may have negative outcomes, up to and including death. If you test positive for cocaine, your surgery will be cancelled.  On the morning of surgery brush your teeth with toothpaste and water, you may rinse your mouth with mouthwash if you wish. Do not swallow any toothpaste or mouthwash.  Use CHG Soap as directed on instruction sheet.  Do not wear jewelry, make-up, hairpins, clips or nail polish.  For welded (permanent) jewelry: bracelets, anklets, waist bands, etc.  Please have this removed prior to surgery.  If it is not removed, there is a chance that hospital personnel will need to cut it off on the day of surgery.  Do not wear lotions, powders, or perfumes.   Do not shave body hair from the neck down 48 hours before surgery.  Contact lenses, hearing aids and dentures may not be worn into surgery.  Do not bring valuables to the hospital. [facility] is not responsible for any missing/lost belongings or valuables.   Notify your doctor if there is any change in your medical condition (cold, fever, infection).  Wear comfortable clothing (specific to your surgery type) to  the hospital.  After surgery, you can help prevent lung complications by doing breathing exercises.  Take deep breaths and cough every 1-2 hours. Your doctor may order a device called an Incentive Spirometer to help you take deep breaths.  If you are being admitted to the hospital overnight, leave your suitcase in the car. After surgery it may be brought to your room.  In case of increased patient census, it may be  necessary for you, the patient, to continue your postoperative care in the Same Day Surgery department.  If you are being discharged the day of surgery, you will not be allowed to drive home. You will need a responsible individual to drive you home and stay with you for 24 hours after surgery.   If you are taking public transportation, you will need to have a responsible individual with you.  Please call the Pre-admissions Testing Dept. at [phone] if you have any questions about these instructions.  Surgery Visitation Policy:  Patients having surgery or a procedure may have two visitors.  Children under the age of 17 must have an adult with them who is not the patient.  Inpatient Visitation:    Visiting hours are 7 a.m. to 8 p.m. Up to four visitors are allowed at one time in a patient room. The visitors may rotate out with other people during the day.  One visitor age 51 or older may stay with the patient overnight and must be in the room by 8 p.m.       Pre-operative 5 CHG Bath Instructions   You can play a key role in reducing the risk of infection after surgery. Your skin needs to be as free of germs as possible. You can reduce the number of germs on your skin by washing with CHG (chlorhexidine gluconate) soap before surgery. CHG is an antiseptic soap that kills germs and continues to kill germs even after washing.   DO NOT use if you have an allergy to chlorhexidine/CHG or antibacterial soaps. If your skin becomes reddened or irritated, stop using the CHG and notify one of our RNs at [phone].   Please shower with the CHG soap starting 4 days before surgery using the following schedule:     Please keep in mind the following:  DO NOT shave, including legs and underarms, starting the day of your first shower.   You may shave your face at any point before/day of surgery.  Place clean sheets on your bed the day you start using CHG soap. Use a clean washcloth (not  used since being washed) for each shower. DO NOT sleep with pets once you start using the CHG.   CHG Shower Instructions:  If you choose to wash your hair and private area, wash first with your normal shampoo/soap.  After you use shampoo/soap, rinse your hair and body thoroughly to remove shampoo/soap residue.  Turn the water OFF and apply about 3 tablespoons (45 ml) of CHG soap to a CLEAN washcloth.  Apply CHG soap ONLY FROM YOUR NECK DOWN TO YOUR TOES (washing for 3-5 minutes)  DO NOT use CHG soap on face, private areas, open wounds, or sores.  Pay special attention to the area where your surgery is being performed.  If you are having back surgery, having someone wash your back for you may be helpful. Wait 2 minutes after CHG soap is applied, then you may rinse off the CHG soap.  Pat dry with a clean towel  Put on  clean clothes/pajamas   If you choose to wear lotion, please use ONLY the CHG-compatible lotions on the back of this paper.     Additional instructions for the day of surgery: DO NOT APPLY any lotions, deodorants, cologne, or perfumes.   Put on clean/comfortable clothes.  Brush your teeth.  Ask your nurse before applying any prescription medications to the skin.      CHG Compatible Lotions   Aveeno Moisturizing lotion  Cetaphil Moisturizing Cream  Cetaphil Moisturizing Lotion  Clairol Herbal Essence Moisturizing Lotion, Dry Skin  Clairol Herbal Essence Moisturizing Lotion, Extra Dry Skin  Clairol Herbal Essence Moisturizing Lotion, Normal Skin  Curel Age Defying Therapeutic Moisturizing Lotion with Alpha Hydroxy  Curel Extreme Care Body Lotion  Curel Soothing Hands Moisturizing Hand Lotion  Curel Therapeutic Moisturizing Cream, Fragrance-Free  Curel Therapeutic Moisturizing Lotion, Fragrance-Free  Curel Therapeutic Moisturizing Lotion, Original Formula  Eucerin Daily Replenishing Lotion  Eucerin Dry Skin Therapy Plus Alpha Hydroxy Crme  Eucerin Dry Skin Therapy  Plus Alpha Hydroxy Lotion  Eucerin Original Crme  Eucerin Original Lotion  Eucerin Plus Crme Eucerin Plus Lotion  Eucerin TriLipid Replenishing Lotion  Keri Anti-Bacterial Hand Lotion  Keri Deep Conditioning Original Lotion Dry Skin Formula Softly Scented  Keri Deep Conditioning Original Lotion, Fragrance Free Sensitive Skin Formula  Keri Lotion Fast Absorbing Fragrance Free Sensitive Skin Formula  Keri Lotion Fast Absorbing Softly Scented Dry Skin Formula  Keri Original Lotion  Keri Skin Renewal Lotion Keri Silky Smooth Lotion  Keri Silky Smooth Sensitive Skin Lotion  Nivea Body Creamy Conditioning Oil  Nivea Body Extra Enriched Lotion  Nivea Body Original Lotion  Nivea Body Sheer Moisturizing Lotion Nivea Crme  Nivea Skin Firming Lotion  NutraDerm 30 Skin Lotion  NutraDerm Skin Lotion  NutraDerm Therapeutic Skin Cream  NutraDerm Therapeutic Skin Lotion  ProShield Protective Hand Cream  Provon moisturizing lotion     Preoperative Educational Videos for Total Hip, Knee and Shoulder Replacements  To better prepare for surgery, please view our videos that explain the physical activity and discharge planning required to have the best surgical recovery at [facility].  [email or web address]  Questions? Call [phone] or email [email or web address]

## 2023-06-07 NOTE — Discharge Instructions (Addendum)
 Instructions after Total Knee Replacement   James P. Hooten, Jr., M.D.    Dept. of Orthopaedics & Sports Medicine [facility] [address] [place], [place]  [address]  Phone: [phone]   Fax: [phone]       [email or web address]       DIET: Drink plenty of non-alcoholic fluids. Resume your normal diet. Include foods high in fiber.  ACTIVITY:  You may use crutches or a walker with weight-bearing as tolerated, unless instructed otherwise. You may be weaned off of the walker or crutches by your Physical Therapist.  Do NOT place pillows under the knee. Anything placed under the knee could limit your ability to straighten the knee.   Use the Bone Foam 3 times a day for 30 minutes each session to help straighten the knee. Continue doing gentle exercises. Exercising will reduce the pain and swelling, increase motion, and prevent muscle weakness.   Please continue to use the TED compression stockings for 6 weeks. You may remove the stockings at night, but should reapply them in the morning. Do not drive or operate any equipment until instructed.  WOUND CARE:  The initial dressing (Aquacel) can remain in place for 7 days (see separate instructions). Continue to use the PolarCare or ice packs periodically to reduce pain and swelling. You may bathe or shower after the staples are removed at the first office visit following surgery.  MEDICATIONS: You may resume your regular medications. Please take the pain medication as prescribed on the medication. Do not take pain medication on an empty stomach. Unless instructed otherwise, you should take an enteric-coated aspirin 81 mg. TWICE a day. (This along with elevation will help reduce the possibility of blood clots/phlebitis in your operated leg.) Use a stool softener (such as Senokot-S or Colace) daily and a laxative (such as Miralax or Dulcolax) as needed to prevent constipation.  Do not drive or drink alcoholic beverages when  taking pain medications.  CALL THE OFFICE FOR: Temperature above 101 degrees Excessive bleeding or drainage on the dressing. Excessive swelling, coldness, or paleness of the toes. Persistent nausea and vomiting.  FOLLOW-UP:  You should have an appointment to return to the office in 10-14 days after surgery. Arrangements have been made for continuation of Physical Therapy (either home therapy or outpatient therapy).     [facility] Department Directory         [email or web address]       [email or web address]          Cardiology  Appointments: [place] - [phone]  Endocrinology  Appointments: [place] [phone] [place] - [phone]  Gastroenterology  Appointments: [place] [phone] [place] - [phone]        General Surgery   Appointments: [facility]  Internal Medicine/Family Medicine  Appointments: [facility] - [phone] [place] - [phone]  Metabolic and Weigh Loss Surgery  Appointments: [facility]        Neurology  Appointments: [place] [phone] [place] - [phone]  Neurosurgery  Appointments: [place]  Obstetrics & Gynecology  Appointments: [place] [phone] [place] - [phone]        Pediatrics  Appointments: [place] [phone] [place] - [phone]  Physiatry  Appointments: [place] [phone]  Physical Therapy  Appointments: [place] - [phone]        Podiatry  Appointments: [place] [phone] [place] - [phone]  Pulmonology  Appointments: [place]  Rheumatology  Appointments: [place] [phone]  [place] Location: [facility]  [address] [place], [place]  [address]  [place] Location: [facility] [address] [place], [place]  [address]  [place]  Location: [facility] [address] [place], [place]  [address]    [facility] [address], [place]  [phone] Open 24 hours  They will call you to set up when they can come for assessment

## 2023-06-07 NOTE — Telephone Encounter (Signed)
   Pre-operative Risk Assessment    Patient Name: Whitney Banks  DOB: 06-Sep-1950 MRN: [id number]   Date of last office visit: 10/13/22 SHERI HAMMOCK, NP Date of next office visit: NONE   Request for Surgical Clearance    Procedure:   ARTHROPLASTY, KNEE, TOTAL, USING IMAGELESS COMPUTER-ASSISTED NAVIGATION  Date of Surgery:  Clearance 06/18/23                                Surgeon:  DR. Francesco Sor   Surgeon's Group or Practice Name:  [facility] Phone number:  [phone] Quentin Mulling, FNP Fax number:  [phone]   Type of Clearance Requested:   - Medical ; NONE TO BE HELD PER REQUEST   Type of Anesthesia:  General    Additional requests/questions:    Elpidio Anis   06/07/2023, 12:32 PM

## 2023-06-07 NOTE — Telephone Encounter (Signed)
   Name: Whitney Banks  DOB: [birth year]  MRN: [id number]  Primary Cardiologist: Lorine Bears, MD   Preoperative team, please contact this patient and set up a phone call appointment for further preoperative risk assessment. Please obtain consent and complete medication review. Thank you for your help.  I confirm that guidance regarding antiplatelet and oral anticoagulation therapy has been completed and, if necessary, noted below.  None requested.   I also confirmed the patient resides in the state of [place]. As per [facility] Medical Board telemedicine laws, the patient must reside in the state in which the provider is licensed.   Carlos Levering, NP 06/07/2023, 1:24 PM [facility]

## 2023-06-07 NOTE — Telephone Encounter (Signed)
 Called patient, NA, LMAM to call back to set up a Televisit for preop clearance.

## 2023-06-08 ENCOUNTER — Telehealth: Payer: Self-pay | Admitting: *Deleted

## 2023-06-08 NOTE — Telephone Encounter (Signed)
 Pt has been scheduled tele preop appt 06/13/23. Med rec and consent are done.     Patient Consent for Virtual Visit        Whitney Banks has provided verbal consent on 06/08/2023 for a virtual visit (video or telephone).   CONSENT FOR VIRTUAL VISIT FOR:  Whitney Banks  By participating in this virtual visit I agree to the following:  I hereby voluntarily request, consent and authorize [facility] and its employed or contracted physicians, physician assistants, nurse practitioners or other licensed health care professionals (the Practitioner), to provide me with telemedicine health care services (the "Services") as deemed necessary by the treating Practitioner. I acknowledge and consent to receive the Services by the Practitioner via telemedicine. I understand that the telemedicine visit will involve communicating with the Practitioner through live audiovisual communication technology and the disclosure of certain medical information by electronic transmission. I acknowledge that I have been given the opportunity to request an in-person assessment or other available alternative prior to the telemedicine visit and am voluntarily participating in the telemedicine visit.  I understand that I have the right to withhold or withdraw my consent to the use of telemedicine in the course of my care at any time, without affecting my right to future care or treatment, and that the Practitioner or I may terminate the telemedicine visit at any time. I understand that I have the right to inspect all information obtained and/or recorded in the course of the telemedicine visit and may receive copies of available information for a reasonable fee.  I understand that some of the potential risks of receiving the Services via telemedicine include:  Delay or interruption in medical evaluation due to technological equipment failure or disruption; Information transmitted may not be sufficient (e.g. poor  resolution of images) to allow for appropriate medical decision making by the Practitioner; and/or  In rare instances, security protocols could fail, causing a breach of personal health information.  Furthermore, I acknowledge that it is my responsibility to provide information about my medical history, conditions and care that is complete and accurate to the best of my ability. I acknowledge that Practitioner's advice, recommendations, and/or decision may be based on factors not within their control, such as incomplete or inaccurate data provided by me or distortions of diagnostic images or specimens that may result from electronic transmissions. I understand that the practice of medicine is not an exact science and that Practitioner makes no warranties or guarantees regarding treatment outcomes. I acknowledge that a copy of this consent can be made available to me via my patient portal [facility] MyChart), or I can request a printed copy by calling the office of [facility].    I understand that my insurance will be billed for this visit.   I have read or had this consent read to me. I understand the contents of this consent, which adequately explains the benefits and risks of the Services being provided via telemedicine.  I have been provided ample opportunity to ask questions regarding this consent and the Services and have had my questions answered to my satisfaction. I give my informed consent for the services to be provided through the use of telemedicine in my medical care

## 2023-06-08 NOTE — Telephone Encounter (Signed)
 Pt has been scheduled tele preop appt 06/13/23. Med rec and consent are done.

## 2023-06-12 ENCOUNTER — Encounter: Payer: Self-pay | Admitting: Orthopedic Surgery

## 2023-06-12 NOTE — Progress Notes (Signed)
 Perioperative / Anesthesia Services  Pre-Admission Testing Clinical Review / Pre-Operative Anesthesia Consult  Date: 06/13/23  Patient Demographics:  Name: Whitney Banks DOB: [birth year] MRN:   [id number]  Planned Surgical Procedure(s):    Case: [id number] Date/Time: 06/18/23 1123   Procedure: ARTHROPLASTY, KNEE, TOTAL, USING IMAGELESS COMPUTER-ASSISTED NAVIGATION (Left: Knee)   Anesthesia type: Choice   Diagnosis: Primary osteoarthritis of left knee [M17.12]   Pre-op diagnosis: Primary osteoarthritis of left knee M17.12   Location: [facility] OR ROOM 01 / [facility]   Surgeons: Donato Heinz, MD      NOTE: Available PAT nursing documentation and vital signs have been reviewed. Clinical nursing staff has updated patient's PMH/PSHx, current medication list, and drug allergies/intolerances to ensure comprehensive history available to assist in medical decision making as it pertains to the aforementioned surgical procedure and anticipated anesthetic course. Extensive review of available clinical information personally performed. [place] PMH and PSHx updated with any diagnoses/procedures that  may have been inadvertently omitted during his intake with the pre-admission testing department's nursing staff.  Clinical Discussion:  Whitney Banks is a 73 y.o. female who is submitted for pre-surgical anesthesia review and clearance prior to her undergoing the above procedure. Patient has never been a smoker in the past. Pertinent PMH includes: CAD, hemorrhagic CVA, chronic cerebral microvascular disease, pulmonary hypertension, aortic atherosclerosis, HTN, HLD, T2DM, asthma, GERD (on daily PPI), anemia, alpha thalassemia carrier, OA.  Patient is followed by cardiology Kirke Corin, MD). She was last seen in the cardiology clinic on 10/13/2022; notes reviewed. At the time of her clinic visit, patient doing well overall from a cardiovascular perspective. Patient denied any chest pain,  shortness of breath, PND, orthopnea, palpitations, significant peripheral edema, weakness, fatigue, vertiginous symptoms, or presyncope/syncope. Patient with a past medical history significant for cardiovascular diagnoses. Documented physical exam was grossly benign, providing no evidence of acute exacerbation and/or decompensation of the patient's known cardiovascular conditions.  Patient with a history of a hemorrhagic stroke in the past.  Date of neurological event unknown.  Neurological defect was noted on brain MRI imaging performed on 10/09/2019, which revealed the old hemorrhagic infarction and the RIGHT basal ganglia and corona radiata.  Following neurological event, patient has no significant residual deficits.  Most recent myocardial perfusion imaging study was performed on 07/27/2022 revealing a normal left ventricular systolic function with an EF of 55%.  There were no regional wall motion abnormalities.  No artifact or left ventricular cavity size enlargement appreciated on review of imaging. SPECT images demonstrated no evidence of stress-induced myocardial ischemia or arrhythmia; no scintigraphic evidence of scar.  TID ratio = 1.03. Study determined to be normal and low risk.  Most recent TTE performed on 08/04/2022 revealed a normal left ventricular systolic function with an EF of 60-65%. There were no regional wall motion abnormalities.  Left ventricular diastolic Doppler parameters normal.  GLS -18.3%. Right ventricular size and function normal with a TAPSE measuring 2.3 cm  (normal range >/= 1.6 cm).  RVSP = 33.9 mmHg.  There was mild mitral and tricuspid valve regurgitation. All transvalvular gradients were noted to be normal providing no evidence suggestive of valvular stenosis. Aorta normal in size with no evidence of ectasia or aneurysmal dilatation.  Blood pressure well controlled at 128/82 mmHg on currently prescribed ARB (losartan) and diuretic (HCTZ) therapies.  Patient not  currently taking any type of lipid-lowering therapies for her HLD diagnosis and further ASCVD prevention. T2DM well controlled on currently prescribed regimen;  last HgbA1c was 5.6% when checked on 06/07/2023. Patient does not have an OSAH diagnosis. Patient is able to complete all of her  ADL/IADLs without cardiovascular limitation.  Per the DASI, patient is able to achieve at least 4 METS of physical activity without experiencing any significant degree of angina/anginal equivalent symptoms. No changes were made to her medication regimen during her visit with cardiology.  Patient scheduled to follow-up with outpatient cardiology in 6 months or sooner if needed.  [name] is scheduled for an elective ARTHROPLASTY, KNEE, TOTAL, USING IMAGELESS COMPUTER-ASSISTED NAVIGATION (Left: Knee) on 06/18/2023 with Dr. Francesco Sor, MD.  Given patient's past medical history significant for cardiovascular diagnoses, presurgical cardiac clearance was sought by the PAT team. Per cardiology, "according to the Revised Cardiac Risk Index (RCRI), her Perioperative Risk of Major Cardiac Event is (%): 0.4. Her Functional Capacity in METs is: 6.36 according to the [place] Activity Status Index (DASI). Therefore, based on ACC/AHA guidelines, patient would be at ACCEPTABLE risk for the planned procedure without further cardiovascular testing".  In review of her medication reconciliation, the patient is not noted to be taking any type of anticoagulation or antiplatelet therapies that would need to be held during her perioperative course.  Patient denies previous perioperative complications with anesthesia in the past. In review her EMR, it is noted that patient underwent a MAC anesthetic course at [facility] in 11/2019 without documented complications.      06/07/2023   11:28 AM 01/08/2023    8:34 AM 12/04/2022    1:13 PM  Vitals with BMI  Height 4\' 11"  4\' 11"  4\' 11"   Weight 218 lbs 228 lbs 6 oz 225 lbs 14  oz  BMI 44.01 46.11 45.6  Systolic 129 118 213  Diastolic 73 82 69  Pulse 86 67 72   Providers/Specialists:  NOTE: Primary physician provider listed below. Patient may have been seen by APP or partner within same practice.   PROVIDER ROLE / SPECIALTY LAST OV  Hooten, Illene Labrador, MD Orthopedics (Surgeon) 05/24/2023  Leanna Sato, MD Primary Care Provider 03/01/2023  Lorine Bears, MD Cardiology 10/13/2022; preop APP call 06/13/2023  Sarina Ser, MD Pulmonary Medicine 01/08/2023  Gerarda Fraction, MD Hematology 12/25/2022   Allergies:   Allergies  Allergen Reactions   Lovastatin Other (See Comments)    muscle cramps   Penicillins Hives   Lisinopril Cough   Current Home Medications:   No current facility-administered medications for this encounter.    albuterol (VENTOLIN HFA) 108 (90 Base) MCG/ACT inhaler   ascorbic acid (VITAMIN C) 500 MG tablet   BYETTA 10 MCG PEN 10 MCG/0.04ML SOPN injection   carboxymethylcellulose (REFRESH PLUS) 0.5 % SOLN   cetirizine (ZYRTEC) 10 MG tablet   Cholecalciferol (VITAMIN D-3 PO)   COD LIVER OIL PO   Cyanocobalamin (VITAMIN B-12 SL)   Fluticasone-Umeclidin-Vilant (TRELEGY ELLIPTA) 200-62.5-25 MCG/ACT AEPB   losartan-hydrochlorothiazide (HYZAAR) 100-25 MG tablet   Multiple Vitamins-Minerals (ZINC PO)   OVER THE COUNTER MEDICATION   pantoprazole (PROTONIX) 40 MG tablet   VITAMIN A PO   History:   Past Medical History:  Diagnosis Date   Alpha thalassemia silent carrier    Anemia    Aortic atherosclerosis (HCC)    CAD (coronary artery disease)    Cerebral microvascular disease    Chronic gastritis    DDD (degenerative disc disease), lumbar    Degenerative arthritis of knee, bilateral    Diverticulosis    Essential hypertension  Gallstones    GERD (gastroesophageal reflux disease)    Hearing difficulty, right    Hemorrhagic stroke [facility])    a.) brain MRI 10/09/2019: old hemorrhagic infarction in the RIGHT basal ganglia  and corona radiata   HLD (hyperlipidemia)    Moderate persistent asthma without complication    Obesity, Class III, 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y) (HCC)    Pneumonia    PONV (postoperative nausea and vomiting)    Postoperative ileus following knee surgery 2014   Pulmonary hypertension (HCC)    SBO (small bowel obstruction) (HCC)    Status post left cataract extraction    Type 2 diabetes mellitus (HCC)    Wears dentures    full upper, partial lower   Wears hearing aid in both ears    Past Surgical History:  Procedure Laterality Date   CATARACT EXTRACTION W/PHACO Left 12/04/2019   Procedure: CATARACT EXTRACTION PHACO AND INTRAOCULAR LENS PLACEMENT (IOC) LEFT DIABETIC VISION  3.40 00:33.8  ;  Surgeon: Elliot Cousin, MD;  Location: [facility];  Service: Ophthalmology;  Laterality: Left;  Diabetic - oral meds   CHOLECYSTECTOMY  1979   COLONOSCOPY WITH PROPOFOL N/A 05/07/2015   Procedure: COLONOSCOPY WITH PROPOFOL;  Surgeon: Wallace Cullens, MD;  Location: [facility] ENDOSCOPY;  Service: Gastroenterology;  Laterality: N/A;   COLONOSCOPY WITH PROPOFOL N/A 11/14/2018   Procedure: COLONOSCOPY WITH PROPOFOL;  Surgeon: Christena Deem, MD;  Location: [facility] ENDOSCOPY;  Service: Endoscopy;  Laterality: N/A;   ESOPHAGOGASTRODUODENOSCOPY (EGD) WITH PROPOFOL N/A 05/07/2015   Procedure: ESOPHAGOGASTRODUODENOSCOPY (EGD) WITH PROPOFOL;  Surgeon: Wallace Cullens, MD;  Location: [facility] ENDOSCOPY;  Service: Gastroenterology;  Laterality: N/A;   ESOPHAGOGASTRODUODENOSCOPY (EGD) WITH PROPOFOL N/A 11/14/2018   Procedure: ESOPHAGOGASTRODUODENOSCOPY (EGD) WITH PROPOFOL;  Surgeon: Christena Deem, MD;  Location: [facility] ENDOSCOPY;  Service: Endoscopy;  Laterality: N/A;   PARTIAL HYSTERECTOMY  1993   REVISION TOTAL KNEE ARTHROPLASTY Right 12/28/2017   TOTAL KNEE ARTHROPLASTY Right 09/02/2012   Family History  Problem Relation Age of Onset   Heart failure Mother    Heart attack Father    Breast cancer Sister 40   Breast  cancer Sister    Social History   Tobacco Use   Smoking status: Never   Smokeless tobacco: Never   Tobacco comments:    tried as teenager  Substance Use Topics   Alcohol use: No   Pertinent Clinical Results:  LABS:  Hospital Outpatient Visit on 06/07/2023  Component Date Value Ref Range Status   MRSA, PCR 06/07/2023 NEGATIVE  NEGATIVE Final   Staphylococcus aureus 06/07/2023 NEGATIVE  NEGATIVE Final   Comment: (NOTE) The Xpert SA Assay (FDA approved for NASAL specimens in patients 58 years of age and older), is one component of a comprehensive surveillance program. It is not intended to diagnose infection nor to guide or monitor treatment. Performed at [facility], [address] [place], [place] [address]    CRP 06/07/2023 1.9 (H)  <1.0 mg/dL Final   Performed at [facility], [address] [place], [place] [address]   Sed Rate 06/07/2023 46 (H)  0 - 30 mm/hr Final   Performed at [facility], [address] [place], [place] [address]   Hgb A1c MFr Bld 06/07/2023 5.6  4.8 - 5.6 % Final   Comment: (NOTE) Pre diabetes:          5.7%-6.4%  Diabetes:              >6.4%  Glycemic  control for   <7.0% adults with diabetes    Mean Plasma Glucose 06/07/2023 114.02  mg/dL Final   Performed at [facility], [address], [place], [place] [address]   WBC 06/07/2023 8.1  4.0 - 10.5 K/uL Final   RBC 06/07/2023 5.44 (H)  3.87 - 5.11 MIL/uL Final   Hemoglobin 06/07/2023 10.6 (L)  12.0 - 15.0 g/dL Final   HCT 60/45/4098 34.0 (L)  36.0 - 46.0 % Final   MCV 06/07/2023 62.5 (L)  80.0 - 100.0 fL Final   MCH 06/07/2023 19.5 (L)  26.0 - 34.0 pg Final   MCHC 06/07/2023 31.2  30.0 - 36.0 g/dL Final   RDW 11/91/4782 18.2 (H)  11.5 - 15.5 % Final   Platelets 06/07/2023 346  150 - 400 K/uL Final   REPEATED TO VERIFY   nRBC 06/07/2023 0.0  0.0 - 0.2 % Final   Performed at [facility], [address] [place], [place] [address]   Sodium  06/07/2023 134 (L)  135 - 145 mmol/L Final   Potassium 06/07/2023 3.9  3.5 - 5.1 mmol/L Final   Chloride 06/07/2023 97 (L)  98 - 111 mmol/L Final   CO2 06/07/2023 27  22 - 32 mmol/L Final   Glucose, Bld 06/07/2023 91  70 - 99 mg/dL Final   Glucose reference range applies only to samples taken after fasting for at least 8 hours.   BUN 06/07/2023 20  8 - 23 mg/dL Final   Creatinine, Ser 06/07/2023 1.05 (H)  0.44 - 1.00 mg/dL Final   Calcium 30/86/5784 9.4  8.9 - 10.3 mg/dL Final   Total Protein 69/62/9528 8.0  6.5 - 8.1 g/dL Final   Albumin 41/32/4401 3.9  3.5 - 5.0 g/dL Final   AST 02/72/5366 14 (L)  15 - 41 U/L Final   ALT 06/07/2023 15  0 - 44 U/L Final   Alkaline Phosphatase 06/07/2023 82  38 - 126 U/L Final   Total Bilirubin 06/07/2023 0.6  0.0 - 1.2 mg/dL Final   GFR, Estimated 06/07/2023 57 (L)  >60 mL/min Final   Comment: (NOTE) Calculated using the CKD-EPI Creatinine Equation (2021)    Anion gap 06/07/2023 10  5 - 15 Final   Performed at [facility], [address] [place], [place] [address]   Color, Urine 06/07/2023 STRAW (A)  YELLOW Final   APPearance 06/07/2023 CLEAR (A)  CLEAR Final   Specific Gravity, Urine 06/07/2023 1.006  1.005 - 1.030 Final   pH 06/07/2023 6.0  5.0 - 8.0 Final   Glucose, UA 06/07/2023 NEGATIVE  NEGATIVE mg/dL Final   Hgb urine dipstick 06/07/2023 NEGATIVE  NEGATIVE Final   Bilirubin Urine 06/07/2023 NEGATIVE  NEGATIVE Final   Ketones, ur 06/07/2023 NEGATIVE  NEGATIVE mg/dL Final   Protein, ur 74/25/9563 NEGATIVE  NEGATIVE mg/dL Final   Nitrite 87/56/4332 NEGATIVE  NEGATIVE Final   Leukocytes,Ua 06/07/2023 SMALL (A)  NEGATIVE Final   RBC / HPF 06/07/2023 0-5  0 - 5 RBC/hpf Final   WBC, UA 06/07/2023 0-5  0 - 5 WBC/hpf Final   Bacteria, UA 06/07/2023 RARE (A)  NONE SEEN Final   Squamous Epithelial / HPF 06/07/2023 0-5  0 - 5 /HPF Final   Mucus 06/07/2023 PRESENT   Final   Performed at [facility], [address]  [place], [place] [address]    ECG: Date: 01/28/2023  Time ECG obtained: 1550 PM Rate: 64 bpm Rhythm: normal sinus Axis (leads I and  aVF): normal Intervals: PR 182 ms. QRS 84 ms. QTc 422 ms. ST segment and T wave changes: No evidence of acute T wave abnormalities or significant ST segment elevation or depression.  Evidence of a possible, age undetermined, prior infarct:  No Comparison: Similar to previous tracing obtained on 07/07/2022   IMAGING / PROCEDURES: TRANSTHORACIC ECHOCARDIOGRAM performed on 08/04/2022 Left ventricular ejection fraction, by estimation, is 60 to 65%. The left ventricle has normal function. The left ventricle has no regional wall motion abnormalities. Left ventricular diastolic parameters were normal. The average left ventricular global longitudinal strain is -18.3 %. The global longitudinal strain is normal.  Right ventricular systolic function is normal. The right ventricular size is normal.  The mitral valve is normal in structure. Mild mitral valve regurgitation.  The aortic valve is tricuspid. Aortic valve regurgitation is not visualized.  The inferior vena cava is normal in size with greater than 50% respiratory variability, suggesting right atrial pressure of 3 mmHg.   MYOCARDIAL PERFUSION IMAGING STUDY (LEXISCAN) performed on 07/27/2022 Pharmacological myocardial perfusion imaging study with no significant  ischemia Normal wall motion EF estimated at 55% No EKG changes concerning for ischemia at peak stress or in recovery. CT attenuation correction images with mild aortic atherosclerosis, minimal coronary calcification noted Low risk scan  PULMONARY FUNCTION TESTING performed on 06/30/2022    Latest Ref Rng & Units 06/30/2022  11:44 AM  PFT Results  FVC-Pre L 1.77   FVC-Predicted Pre % 72   FVC-Post L 1.86   FVC-Predicted Post % 76   Pre FEV1/FVC % % 90   Post FEV1/FCV % % 88   FEV1-Pre L 1.59   FEV1-Predicted Pre % 86   FEV1-Post L 1.63   DLCO  uncorrected ml/min/mmHg 13.20   DLCO UNC% % 80   DLVA Predicted % 98   TLC L 3.71   TLC % Predicted % 86   RV % Predicted % 98     Impression and Plan:  [name] has been referred for pre-anesthesia review and clearance prior to her undergoing the planned anesthetic and procedural courses. Available labs, pertinent testing, and imaging results were personally reviewed by me in preparation for upcoming operative/procedural course. [facility] medical record has been updated following extensive record review and patient interview with PAT staff.   This patient has been appropriately cleared by cardiology with an overall ACCEPTABLE risk of experiencing significant perioperative cardiovascular complications. Based on clinical review performed today (06/13/23), barring any significant acute changes in the patient's overall condition, it is anticipated that she will be able to proceed with the planned surgical intervention. Any acute changes in clinical condition may necessitate her procedure being postponed and/or cancelled. Patient will meet with anesthesia team (MD and/or CRNA) on the day of her procedure for preoperative evaluation/assessment. Questions regarding anesthetic course will be fielded at that time.   Pre-surgical instructions were reviewed with the patient during his PAT appointment, and questions were fielded to satisfaction by PAT clinical staff. She has been instructed on which medications that she will need to hold prior to surgery, as well as the ones that have been deemed safe/appropriate to take on the day of her procedure. As part of the general education provided by PAT, patient made aware both verbally and in writing, that she would need to abstain from the use of any illegal substances during her perioperative course. She was advised that failure to follow the provided instructions could necessitate case cancellation or result in serious  perioperative complications up to and  including death. Patient encouraged to contact PAT and/or her surgeon's office to discuss any questions or concerns that may arise prior to surgery; verbalized understanding.   Quentin Mulling, MSN, APRN, FNP-C, CEN [facility]  Perioperative Services Nurse Practitioner Phone: [phone] Fax: [phone] 06/13/23 3:42 PM  NOTE: This note has been prepared using Dragon dictation software. Despite my best ability to proofread, there is always the potential that unintentional transcriptional errors may still occur from this process.

## 2023-06-13 ENCOUNTER — Ambulatory Visit: Attending: Cardiovascular Disease | Admitting: Emergency Medicine

## 2023-06-13 DIAGNOSIS — Z01810 Encounter for preprocedural cardiovascular examination: Secondary | ICD-10-CM | POA: Diagnosis not present

## 2023-06-13 NOTE — Progress Notes (Signed)
 Virtual Visit via Telephone Note   Because of Whitney Banks co-morbid illnesses, she is at least at moderate risk for complications without adequate follow up.  This format is felt to be most appropriate for this patient at this time.  Due to technical limitations with video connection (technology), today's appointment will be conducted as an audio only telehealth visit, and Whitney Banks verbally agreed to proceed in this manner.   All issues noted in this document were discussed and addressed.  No physical exam could be performed with this format.  Evaluation Performed:  Preoperative cardiovascular risk assessment _____________   Date:  06/13/2023   Patient ID:  Whitney Banks, DOB 09-04-1950, MRN [id number] Patient Location:  Home Provider location:   Office  Primary Care Provider:  Leanna Sato, MD Primary Cardiologist:  Lorine Bears, MD  Chief Complaint / Patient Profile   73 y.o. y/o female with a h/o hypertension, asthma, obesity, type 2 diabetes, hyperlipidemia, pulmonary hypertension who is pending total knee arthroplasty with [facility] by Dr. Francesco Sor on 06/18/2023 and presents today for telephonic preoperative cardiovascular risk assessment.  History of Present Illness    Whitney Banks is a 73 y.o. female who presents via audio/video conferencing for a telehealth visit today.  Pt was last seen in cardiology clinic on 10/13/2022 by Charlsie Quest, NP.  At that time Whitney Banks was doing well.  The patient is now pending procedure as outlined above. Since her last visit, she denies chest pain, shortness of breath, lower extremity edema, fatigue, palpitations, melena, hematuria, hemoptysis, diaphoresis, weakness, presyncope, syncope, orthopnea, and PND.  She is without any acute cardiovascular concerns or complaints.  She notes that she feels well overall.  She remains very active and does enjoy dancing.  She denies any anginal symptoms during activity.  Past  Medical History    Past Medical History:  Diagnosis Date   Alpha thalassemia silent carrier    Anemia    Aortic atherosclerosis (HCC)    CAD (coronary artery disease)    Cerebral microvascular disease    Chronic gastritis    DDD (degenerative disc disease), lumbar    Degenerative arthritis of knee, bilateral    Diverticulosis    Essential hypertension    Gallstones    GERD (gastroesophageal reflux disease)    Hearing difficulty, right    Hemorrhagic stroke (HCC)    a.) brain MRI 10/09/2019: old hemorrhagic infarction in the RIGHT basal ganglia and corona radiata   HLD (hyperlipidemia)    Moderate persistent asthma without complication    Obesity, Class III, 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y) (HCC)    Pneumonia    PONV (postoperative nausea and vomiting)    Postoperative ileus following knee surgery 2014   Pulmonary hypertension (HCC)    SBO (small bowel obstruction) (HCC)    Status post left cataract extraction    Type 2 diabetes mellitus (HCC)    Wears dentures    full upper, partial lower   Wears hearing aid in both ears    Past Surgical History:  Procedure Laterality Date   CATARACT EXTRACTION W/PHACO Left 12/04/2019   Procedure: CATARACT EXTRACTION PHACO AND INTRAOCULAR LENS PLACEMENT (IOC) LEFT DIABETIC VISION  3.40 00:33.8  ;  Surgeon: Elliot Cousin, MD;  Location: [facility];  Service: Ophthalmology;  Laterality: Left;  Diabetic - oral meds   CHOLECYSTECTOMY  1979   COLONOSCOPY WITH PROPOFOL N/A 05/07/2015   Procedure: COLONOSCOPY WITH PROPOFOL;  Surgeon: Ezzard Standing  Bluford Kaufmann, MD;  Location: [facility] ENDOSCOPY;  Service: Gastroenterology;  Laterality: N/A;   COLONOSCOPY WITH PROPOFOL N/A 11/14/2018   Procedure: COLONOSCOPY WITH PROPOFOL;  Surgeon: Christena Deem, MD;  Location: [facility] ENDOSCOPY;  Service: Endoscopy;  Laterality: N/A;   ESOPHAGOGASTRODUODENOSCOPY (EGD) WITH PROPOFOL N/A 05/07/2015   Procedure: ESOPHAGOGASTRODUODENOSCOPY (EGD) WITH PROPOFOL;  Surgeon: Wallace Cullens,  MD;  Location: [facility] ENDOSCOPY;  Service: Gastroenterology;  Laterality: N/A;   ESOPHAGOGASTRODUODENOSCOPY (EGD) WITH PROPOFOL N/A 11/14/2018   Procedure: ESOPHAGOGASTRODUODENOSCOPY (EGD) WITH PROPOFOL;  Surgeon: Christena Deem, MD;  Location: [facility] ENDOSCOPY;  Service: Endoscopy;  Laterality: N/A;   PARTIAL HYSTERECTOMY  1993   REVISION TOTAL KNEE ARTHROPLASTY Right 12/28/2017   TOTAL KNEE ARTHROPLASTY Right 09/02/2012    Allergies  Allergies  Allergen Reactions   Lovastatin Other (See Comments)    muscle cramps   Penicillins Hives   Lisinopril Cough    Home Medications    Prior to Admission medications   Medication Sig Start Date End Date Taking? Authorizing Provider  albuterol (VENTOLIN HFA) 108 (90 Base) MCG/ACT inhaler Inhale 1-2 puffs into the lungs every 6 (six) hours as needed for wheezing or shortness of breath.    [provider]  ascorbic acid (VITAMIN C) 500 MG tablet Take 500 mg by mouth in the morning.    [provider]  BYETTA 10 MCG PEN 10 MCG/0.04ML SOPN injection Inject 10 mcg into the skin 2 (two) times daily with a meal. 01/04/23   [provider]  carboxymethylcellulose (REFRESH PLUS) 0.5 % SOLN Place 1-2 drops into both eyes 3 (three) times daily as needed (dry/irritated eyes.).    [provider]  cetirizine (ZYRTEC) 10 MG tablet Take 10 mg by mouth in the morning.    [provider]  Cholecalciferol (VITAMIN D-3 PO) Take 1 tablet by mouth in the morning.    [provider]  COD LIVER OIL PO Take 1 capsule by mouth in the morning.    [provider]  Cyanocobalamin (VITAMIN B-12 SL) Take 1 tablet by mouth in the morning.    [provider]  Fluticasone-Umeclidin-Vilant (TRELEGY ELLIPTA) 200-62.5-25 MCG/ACT AEPB Inhale 1 puff into the lungs daily. 06/26/22   Salena Saner, MD  losartan-hydrochlorothiazide (HYZAAR) 100-25 MG tablet Take 1 tablet by mouth in the morning.    [provider]  Multiple Vitamins-Minerals (ZINC PO) Take 1 tablet by mouth in the morning.    [provider]  OVER THE COUNTER MEDICATION Take 1 capsule by mouth in the morning. SEA MOSS    [provider]  pantoprazole (PROTONIX) 40 MG tablet Take 40 mg by mouth in the morning and at bedtime.    [provider]  VITAMIN A PO Take 1 capsule by mouth in the morning.    [provider]    Physical Exam    Vital Signs:  Whitney Banks does not have vital signs available for review today.  Given telephonic nature of communication, physical exam is limited. AAOx3. NAD. Normal affect.  Speech and respirations are unlabored.  Accessory Clinical Findings    None  Assessment & Plan    1.  Preoperative Cardiovascular Risk Assessment: According to the Revised Cardiac Risk Index (RCRI), her Perioperative Risk of Major Cardiac Event is (%): 0.4. Her Functional Capacity in METs is: 6.36 according to the [facility] Activity Status Index (DASI). Therefore, based on ACC/AHA guidelines, patient would be at acceptable risk for the planned procedure without further  cardiovascular testing.   The patient was advised that if she develops new symptoms prior to surgery to contact our office to arrange for a follow-up visit, and she verbalized understanding.  Patient is not on antiplatelet or anticoagulant therapy  A copy of this note will be routed to requesting surgeon.  Time:   Today, I have spent 6 minutes with the patient with telehealth technology discussing medical history, symptoms, and management plan.     Denyce Robert, NP  06/13/2023, 8:41 AM

## 2023-06-16 LAB — IGE: IgE (Immunoglobulin E), Serum: 2116 [IU]/mL — ABNORMAL HIGH (ref 6–495)

## 2023-06-16 NOTE — Anesthesia Preprocedure Evaluation (Signed)
 Anesthesia Evaluation  Patient identified by MRN, date of birth, ID band Patient awake    Reviewed: Allergy & Precautions, H&P , NPO status , Patient's Chart, lab work & pertinent test results  History of Anesthesia Complications (+) PONV and history of anesthetic complications  Airway Mallampati: III  TM Distance: >3 FB Neck ROM: full    Dental  (+) Edentulous Upper, Missing,    Pulmonary shortness of breath, asthma    Pulmonary exam normal        Cardiovascular Exercise Tolerance: Poor hypertension, pulmonary hypertension+ CAD  Normal cardiovascular exam  TRANSTHORACIC ECHOCARDIOGRAM performed on 08/04/2022 1. Left ventricular ejection fraction, by estimation, is 60 to 65%. The left ventricle has normal function. The left ventricle has no regional wall motion abnormalities. Left ventricular diastolic parameters were normal. The average left ventricular global longitudinal strain is -18.3 %. The global longitudinal strain is normal.  2. Right ventricular systolic function is normal. The right ventricular size is normal.  3. The mitral valve is normal in structure. Mild mitral valve regurgitation.  4. The aortic valve is tricuspid. Aortic valve regurgitation is not visualized.  5. The inferior vena cava is normal in size with greater than 50% respiratory variability, suggesting right atrial pressure of 3 mmHg.    MYOCARDIAL PERFUSION IMAGING STUDY (LEXISCAN) performed on 07/27/2022 1. Pharmacological myocardial perfusion imaging study with no significant  ischemia Normal wall motion 2. EF estimated at 55% 3. No EKG changes concerning for ischemia at peak stress or in recovery. 4. CT attenuation correction images with mild aortic atherosclerosis, minimal coronary calcification noted 5. Low risk scan   PULMONARY FUNCTION TESTING performed on 06/30/2022     Latest Ref Rng & Units 06/30/2022  11:44 AM PFT Results FVC-Pre L 1.77   FVC-Predicted Pre % 72  FVC-Post L 1.86  FVC-Predicted Post % 76  Pre FEV1/FVC % % 90  Post FEV1/FCV % % 88  FEV1-Pre L 1.59  FEV1-Predicted Pre % 86  FEV1-Post L 1.63  DLCO uncorrected ml/min/mmHg 13.20  DLCO [facility]% % 80  DLVA Predicted % 98  TLC L 3.71  TLC % Predicted % 86  RV % Predicted % 98       Neuro/Psych  Cerebral microvascular disease Hemorrhagic stroke (HCC)     Comment:  a.) brain MRI 10/09/2019: old hemorrhagic infarction in               the RIGHT basal ganglia and corona radiata CVA (pt denies history of stroke)  negative psych ROS   GI/Hepatic Neg liver ROS,GERD  ,,  Endo/Other  diabetes  Class 3 obesity  Renal/GU      Musculoskeletal  (+) Arthritis ,    Abdominal  (+) + obese  Peds  Hematology  (+) Blood dyscrasia, anemia   Anesthesia Other Findings PAT note reviewed  Past Medical History: No date: Alpha thalassemia silent carrier No date: Anemia No date: Aortic atherosclerosis (HCC) No date: CAD (coronary artery disease) No date: Cerebral microvascular disease No date: Chronic gastritis No date: DDD (degenerative disc disease), lumbar No date: Degenerative arthritis of knee, bilateral No date: Diverticulosis No date: Essential hypertension No date: Gallstones No date: GERD (gastroesophageal reflux disease) No date: Hearing difficulty, right No date: Hemorrhagic stroke [facility])     Comment:  a.) brain MRI 10/09/2019: old hemorrhagic infarction in               the RIGHT basal ganglia and corona radiata No date: HLD (hyperlipidemia) No date:  Moderate persistent asthma without complication No date: Obesity, Class III, 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y) (HCC) No date: Pneumonia No date: PONV (postoperative nausea and vomiting) 2014: Postoperative ileus following knee surgery No date: Pulmonary hypertension (HCC) No date: SBO (small bowel obstruction) (HCC) No date: Status post left cataract extraction No date: Type 2 diabetes mellitus  (HCC) No date: Wears dentures     Comment:  full upper, partial lower No date: Wears hearing aid in both ears  Past Surgical History: 12/04/2019: CATARACT EXTRACTION W/PHACO; Left     Comment:  Procedure: CATARACT EXTRACTION PHACO AND INTRAOCULAR               LENS PLACEMENT (IOC) LEFT DIABETIC VISION  3.40 00:33.8                ;  Surgeon: Ola Berger, MD;  Location: [facility]               [facility];  Service: Ophthalmology;  Laterality: Left;                Diabetic - oral meds 1979: CHOLECYSTECTOMY 05/07/2015: COLONOSCOPY WITH PROPOFOL; N/A     Comment:  Procedure: COLONOSCOPY WITH PROPOFOL;  Surgeon: Stephens Eis, MD;  Location: [facility] ENDOSCOPY;  Service:               Gastroenterology;  Laterality: N/A; 11/14/2018: COLONOSCOPY WITH PROPOFOL; N/A     Comment:  Procedure: COLONOSCOPY WITH PROPOFOL;  Surgeon:               Deveron Fly, MD;  Location: [facility] ENDOSCOPY;                Service: Endoscopy;  Laterality: N/A; 05/07/2015: ESOPHAGOGASTRODUODENOSCOPY (EGD) WITH PROPOFOL; N/A     Comment:  Procedure: ESOPHAGOGASTRODUODENOSCOPY (EGD) WITH               PROPOFOL;  Surgeon: Stephens Eis, MD;  Location: [facility]               ENDOSCOPY;  Service: Gastroenterology;  Laterality: N/A; 11/14/2018: ESOPHAGOGASTRODUODENOSCOPY (EGD) WITH PROPOFOL; N/A     Comment:  Procedure: ESOPHAGOGASTRODUODENOSCOPY (EGD) WITH               PROPOFOL;  Surgeon: Deveron Fly, MD;  Location:               [facility] ENDOSCOPY;  Service: Endoscopy;  Laterality: N/A; 1993: PARTIAL HYSTERECTOMY 12/28/2017: REVISION TOTAL KNEE ARTHROPLASTY; Right 09/02/2012: TOTAL KNEE ARTHROPLASTY; Right     Reproductive/Obstetrics negative OB ROS                             Anesthesia Physical Anesthesia Plan  ASA: 3  Anesthesia Plan: Spinal   Post-op Pain Management: Regional block*, Celebrex PO (pre-op)* and Ofirmev IV (intra-op)*   Induction: Intravenous  PONV Risk  Score and Plan: Propofol infusion  Airway Management Planned: Natural Airway  Additional Equipment:   Intra-op Plan:   Post-operative Plan:   Informed Consent: I have reviewed the patients History and Physical, chart, labs and discussed the procedure including the risks, benefits and alternatives for the proposed anesthesia with the patient or authorized representative who has indicated his/her understanding and acceptance.     Dental Advisory Given  Plan Discussed with: CRNA and Surgeon  Anesthesia Plan Comments:  Anesthesia Quick Evaluation

## 2023-06-17 ENCOUNTER — Encounter: Payer: Self-pay | Admitting: Orthopedic Surgery

## 2023-06-17 MED ORDER — TRANEXAMIC ACID-NACL 1000-0.7 MG/100ML-% IV SOLN
1000.0000 mg | INTRAVENOUS | Status: AC
  Administered 2023-06-18: 1000 mg via INTRAVENOUS

## 2023-06-17 MED ORDER — CHLORHEXIDINE GLUCONATE 4 % EX SOLN: 60.0000 mL | Freq: Once | CUTANEOUS | Status: DC

## 2023-06-17 MED ORDER — SODIUM CHLORIDE 0.9 % IV SOLN: INTRAVENOUS | Status: DC

## 2023-06-17 MED ORDER — CHLORHEXIDINE GLUCONATE 0.12 % MT SOLN
15.0000 mL | Freq: Once | OROMUCOSAL | Status: AC
  Administered 2023-06-18: 15 mL via OROMUCOSAL

## 2023-06-17 MED ORDER — GABAPENTIN 300 MG PO CAPS
300.0000 mg | ORAL_CAPSULE | Freq: Once | ORAL | Status: AC
  Administered 2023-06-18: 300 mg via ORAL

## 2023-06-17 MED ORDER — DEXAMETHASONE SODIUM PHOSPHATE 10 MG/ML IJ SOLN
8.0000 mg | Freq: Once | INTRAMUSCULAR | Status: AC
  Administered 2023-06-18: 8 mg via INTRAVENOUS

## 2023-06-17 MED ORDER — CEFAZOLIN SODIUM-DEXTROSE 2-4 GM/100ML-% IV SOLN
2.0000 g | INTRAVENOUS | Status: AC
  Administered 2023-06-18: 2 g via INTRAVENOUS

## 2023-06-17 MED ORDER — CELECOXIB 200 MG PO CAPS
400.0000 mg | ORAL_CAPSULE | Freq: Once | ORAL | Status: AC
  Administered 2023-06-18: 400 mg via ORAL

## 2023-06-17 MED ORDER — ORAL CARE MOUTH RINSE: 15.0000 mL | Freq: Once | OROMUCOSAL | Status: AC

## 2023-06-17 NOTE — H&P (Signed)
 ORTHOPAEDIC HISTORY & PHYSICAL Daman Steffenhagen, Kalvin Orf., MD - 05/24/2023 10:30 AM EDT Formatting of this note is different from the original. Images from the original note were not included. Chief Complaint: Chief Complaint Patient presents with Bilateral knee pain Right knee degenerative arthrosis, s/p left total knee arthroplasty revision [facility]  Reason for Visit: The patient is a 73 y.o. female who presents today with her husband for reevaluation of both knees. She reports a long history of progressive left knee pain. She localizes most of the pain along the medial and anterior aspect of the knee. She reports some swelling, no locking, and significant giving way of the knee. The pain is aggravated by any weight bearing and rising after sitting. The knee pain limits the patient's ability to ambulate long distances. The patient has not appreciated any significant improvement despite intra-articular corticosteroid injections, viscosupplementation, activity modification, and aquatics exercise. She is using a cane for ambulation. The patient states that the left knee pain has progressed to the point that it is significantly interfering with her activities of daily living.  The patient underwent right total knee arthroplasty on 09/02/2012 as per Dr. Ginette Lah [facility]). The patella was not resurfaced at the index procedure. She reportedly had persistent right knee pain and subsequently underwent right knee revision with patellar resurfacing and liner exchange as per Dr. Sherrilee Doles [facility]) on 12/28/2017. She does report some pain to the right thigh typically radiating from the knee proximally to the anterolateral thigh. She denies any significant swelling of the right knee.  Medications: Current Outpatient Medications Medication Sig Dispense Refill ACCU-CHEK GUIDE ME GLUCOSE MTR Misc 1 each as directed ACCU-CHEK GUIDE TEST STRIPS test strip 1 strip 3 (three) times daily albuterol MDI, PROVENTIL, VENTOLIN, PROAIR,  HFA 90 mcg/actuation inhaler Inhale 1 Puff into the lungs every 6 (six) hours as needed for Wheezing or Shortness of Breath ascorbic acid, vitamin C, (VITAMIN C) 500 MG tablet Take 1 tablet (500 mg total) by mouth every morning BYETTA 10 mcg/dose(250 mcg/mL) 2.4 mL pen injector Inject 10 mcg subcutaneously cetirizine (ZYRTEC) 10 MG tablet Take 1 tablet (10 mg total) by mouth every morning cyanocobalamin, vitamin B-12, 2,500 mcg Tab Take 1 tablet by mouth every morning fluticasone propionate (FLONASE) 50 mcg/actuation nasal spray Place 2 sprays into both nostrils once daily lisinopril-hydrochlorothiazide (PRINZIDE,ZESTORETIC) 20-12.5 mg tablet Take 1 tablet by mouth 2 (two) times daily losartan potassium (LOSARTAN ORAL) Take by mouth every morning losartan-hydroCHLOROthiazide (HYZAAR) 100-25 mg tablet Take 1 tablet by mouth once daily montelukast (SINGULAIR) 10 mg tablet olopatadine (PATANOL) 0.1 % ophthalmic solution Place 1 drop into both eyes 2 (two) times daily pantoprazole (PROTONIX) 20 MG DR tablet Take 1 tablet (20 mg total) by mouth 2 (two) times daily before meals potassium 99 mg Tab Take by mouth every morning solifenacin (VESICARE) 10 MG tablet TRELEGY ELLIPTA 200-62.5-25 mcg inhaler Inhale 1 Puff into the lungs once daily TRUEPLUS PEN NEEDLE 31 gauge x 1/4" needle UNKNOWN TO PATIENT Take 2 tablets by mouth every morning Stool softner zinc 50 mg Tab Take 1 tablet by mouth every morning  No current facility-administered medications for this visit.  Allergies: Allergies Allergen Reactions Penicillins Hives Lisinopril Cough  Past Medical History: Past Medical History: Diagnosis Date Chronic gastritis 05/07/2015 Diabetes 1.5, managed as type 2 (CMS/HHS-HCC) Hyperlipidemia Hypertension Tubular adenoma of colon 05/07/2015  Past Surgical History: Past Surgical History: Procedure Laterality Date COLONOSCOPY 05/07/2015 Tubular adenoma of colon/repeat 31yrs/PYO EGD  05/07/2015 Chronic gastritis/No Repeat/PYO COLONOSCOPY 11/14/2018 Nomral Colon/PHx CP/Repeat  43yrs/MUS EGD 11/14/2018 Gastritis/GERD/Needs EUS for Duodeneal lesion/MUS ESOPHAGOGASTRODOUDENOSCOPY N/A 11/19/2018 Procedure: ESOPHAGOGASTRODUODENOSCOPY, FLEXIBLE, TRANSORAL; WITH ENDOSCOPIC MUCOSAL RESECTION; Surgeon: Burbridge, Aloha Jakes, MD; Location: [facility]; Service: Gastroenterology; Laterality: N/A; CHOLECYSTECTOMY HYSTERECTOMY VAGINAL REPLACEMENT TOTAL KNEE Right knee  Social History: Social History  Socioeconomic History Marital status: Married Spouse name: Locious Number of children: 4 Years of education: 14 Occupational History 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 Tobacco Use Smoking status: Never Smokeless tobacco: Former Clinical biochemist status: Never Used Substance and Sexual Activity Alcohol use: Not Currently Drug use: Not Currently  Social Drivers of Catering manager Strain: Low Risk (04/09/2023) Overall Financial Resource Strain (CARDIA) Difficulty of Paying Living Expenses: Not very hard Food Insecurity: No Food Insecurity (04/09/2023) Hunger Vital Sign Worried About Running Out of Food in the Last Year: Never true Ran Out of Food in the Last Year: Never true Transportation Needs: No Transportation Needs (04/09/2023) PRAPARE - Contractor (Medical): No Lack of Transportation (Non-Medical): No Housing Stability: Unknown (04/09/2023) Housing Stability Vital Sign Unable to Pay for Housing in the Last Year: No Homeless in the Last Year: No  Family History: Family History Problem Relation Name Age of Onset Congenital heart disease Mother Myocardial Infarction (Heart attack) Father Breast cancer Sister Anesthesia problems Neg Hx  Review of Systems: A comprehensive 14 point ROS was performed, reviewed, and the pertinent orthopaedic findings are documented in the HPI.  Exam BP 138/78  Ht 149.9 cm (4\' 11" )  Wt  99.7 kg (219 lb 12.8 oz)  BMI 44.39 kg/m  General: Well-developed, well-nourished female seen in no acute distress. Antalgic gait. Varus thrust to the left knee.  HEENT: Atraumatic, normocephalic. Pupils are equal and reactive to light. Extraocular motion is intact. Sclera are clear. Oropharynx is clear with moist mucosa.  Neck: Supple, nontender, and with good ROM. No thyromegaly, adenopathy, JVD, or carotid bruits.  Lungs: Clear to auscultation bilaterally.  Cardiovascular: Regular rate and rhythm. Normal S1, S2. No murmur . No appreciable gallops or rubs. Peripheral pulses are palpable. No lower extremity edema. Homan`s test is negative.  Abdomen: Soft, nontender, nondistended. Bowel sounds are present.  Extremities: Good strength, stability, and range of motion of the upper extremities. Good range of motion of the hips and ankles.  Left Knee: Soft tissue swelling: mild Effusion: minimal Erythema: none Crepitance: mild Tenderness: medial, anterior Alignment: relative varus Mediolateral laxity: medial pseudolaxity Posterior sag: negative Patellar tracking: Good tracking without evidence of subluxation or tilt Atrophy: No significant atrophy. Quadriceps tone was fair to good. Range of motion: 0/4/108 degrees  Right Knee: Soft tissue swelling: none Effusion: none Erythema: none Crepitance: none Tenderness: No focal tenderness Alignment: normal Mediolateral laxity: stable Atrophy: No significant atrophy. Quadriceps tone was fair to good. Range of Motion: 0/0/120 degrees  Neurologic: Awake, alert, and oriented. Sensory function is intact to pinprick and light touch. Motor strength is judged to be 5/5. Motor coordination is within normal limits. No apparent clonus. No tremor.  X-rays: I ordered and interpreted standing AP, lateral, and sunrise views of the right knee that were obtained in the office today. Good position of the total knee implants. Good  alignment is noted on the AP view. Good cement mantle is appreciated without evidence of loosening. No evidence of polyethylene wear or osteolysis. Slight lateral tilt of the patella is noted on the sunrise view. No evidence of fracture or dislocation.  I ordered and interpreted standing AP, lateral, and sunrise radiographs of the left knee that were obtained in  the office today. There is significant narrowing of the medial cartilage space with bone-on-bone articulation and associated varus alignment. Osteophyte formation is noted. Subchondral sclerosis is noted. Degenerative changes to the patellofemoral articulation are noted. No evidence of fracture or dislocation.  Impression: Degenerative arthrosis of the left knee Right total knee revision arthroplasty  Plan: The findings were discussed in detail with the patient and her husband. She was reassured about the clinical and radiographic findings regarding the right knee. The right knee may be compensating for the progressive changes to the left knee. The patient was given informational material on total knee replacement. Conservative treatment options were reviewed with the patient. We discussed the risks and benefits of surgical intervention. The usual perioperative course was also discussed in detail. The patient expressed understanding of the risks and benefits of surgical intervention and would like to proceed with plans for left total knee arthroplasty.  Hemoglobin A1c is an indication of glucose control. Uncontrolled diabetes has been associated with perioperative complications including poor wound healing and surgical site infections. To decrease the risk of perioperative complications, hemoglobin A1c must be less than 8.0 prior to surgery. The patient is encouraged to work with her primary care physician and/or endocrinologist to optimize glucose management.  I spent a total of 50 minutes in both face-to-face and non-face-to-face  activities, excluding procedures performed, for this visit on the date of this encounter.  MEDICAL CLEARANCE: Per anesthesiology. ACTIVITY: As tolerated. WORK STATUS: Not applicable. THERAPY: Preoperative physical therapy evaluation. MEDICATIONS: Requested Prescriptions  No prescriptions requested or ordered in this encounter  FOLLOW-UP: Return for preop History & Physical pending surgery date.  Enya Bureau P. Ellorie Kindall, Jr., M.D.  This note was generated in part with voice recognition software and I apologize for any typographical errors that were not detected and corrected.

## 2023-06-18 ENCOUNTER — Encounter: Payer: Self-pay | Admitting: Orthopedic Surgery

## 2023-06-18 ENCOUNTER — Other Ambulatory Visit: Payer: Self-pay

## 2023-06-18 ENCOUNTER — Ambulatory Visit: Payer: Self-pay | Admitting: Urgent Care

## 2023-06-18 ENCOUNTER — Encounter
Admission: RE | Disposition: A | Discharge status: Home / Self Care | Payer: Self-pay | Source: Home / Self Care | Attending: Orthopedic Surgery

## 2023-06-18 ENCOUNTER — Observation Stay
Admission: RE | Admit: 2023-06-18 | Discharge: 2023-06-20 | Disposition: A | Discharge status: Home / Self Care | Attending: Orthopedic Surgery | Admitting: Orthopedic Surgery

## 2023-06-18 ENCOUNTER — Observation Stay

## 2023-06-18 DIAGNOSIS — Z87891 Personal history of nicotine dependence: Secondary | ICD-10-CM | POA: Diagnosis not present

## 2023-06-18 DIAGNOSIS — M1712 Unilateral primary osteoarthritis, left knee: Secondary | ICD-10-CM | POA: Diagnosis present

## 2023-06-18 DIAGNOSIS — E119 Type 2 diabetes mellitus without complications: Secondary | ICD-10-CM | POA: Insufficient documentation

## 2023-06-18 DIAGNOSIS — Z79899 Other long term (current) drug therapy: Secondary | ICD-10-CM | POA: Diagnosis not present

## 2023-06-18 DIAGNOSIS — D563 Thalassemia minor: Secondary | ICD-10-CM

## 2023-06-18 DIAGNOSIS — Z880 Allergy status to penicillin: Secondary | ICD-10-CM

## 2023-06-18 DIAGNOSIS — Z96652 Presence of left artificial knee joint: Secondary | ICD-10-CM

## 2023-06-18 DIAGNOSIS — I272 Pulmonary hypertension, unspecified: Secondary | ICD-10-CM

## 2023-06-18 DIAGNOSIS — Z96651 Presence of right artificial knee joint: Secondary | ICD-10-CM | POA: Insufficient documentation

## 2023-06-18 DIAGNOSIS — I10 Essential (primary) hypertension: Secondary | ICD-10-CM | POA: Diagnosis not present

## 2023-06-18 DIAGNOSIS — Z01812 Encounter for preprocedural laboratory examination: Secondary | ICD-10-CM

## 2023-06-18 DIAGNOSIS — R0600 Dyspnea, unspecified: Secondary | ICD-10-CM

## 2023-06-18 HISTORY — DX: Other intervertebral disc degeneration, lumbar region without mention of lumbar back pain or lower extremity pain: M51.369

## 2023-06-18 HISTORY — PX: KNEE ARTHROPLASTY: SHX992

## 2023-06-18 HISTORY — DX: Other cerebrovascular disease: I67.89

## 2023-06-18 HISTORY — DX: Diverticulosis of intestine, part unspecified, without perforation or abscess without bleeding: K57.90

## 2023-06-18 HISTORY — DX: Cataract extraction status, left eye: Z98.42

## 2023-06-18 HISTORY — DX: Unspecified intestinal obstruction, unspecified as to partial versus complete obstruction: K56.609

## 2023-06-18 LAB — GLUCOSE, CAPILLARY
Glucose-Capillary: 109 mg/dL — ABNORMAL HIGH (ref 70–99)
Glucose-Capillary: 163 mg/dL — ABNORMAL HIGH (ref 70–99)

## 2023-06-18 SURGERY — ARTHROPLASTY, KNEE, TOTAL, USING IMAGELESS COMPUTER-ASSISTED NAVIGATION
Anesthesia: Spinal | Site: Knee | Laterality: Left

## 2023-06-18 MED ORDER — FENTANYL CITRATE (PF) 100 MCG/2ML IJ SOLN
INTRAMUSCULAR | Status: AC
  Filled 2023-06-18: qty 2

## 2023-06-18 MED ORDER — CEFAZOLIN SODIUM-DEXTROSE 2-4 GM/100ML-% IV SOLN
INTRAVENOUS | Status: AC
  Filled 2023-06-18: qty 100

## 2023-06-18 MED ORDER — PHENYLEPHRINE HCL-NACL 20-0.9 MG/250ML-% IV SOLN
INTRAVENOUS | Status: AC
  Filled 2023-06-18: qty 250

## 2023-06-18 MED ORDER — GABAPENTIN 300 MG PO CAPS
ORAL_CAPSULE | ORAL | Status: AC
  Filled 2023-06-18: qty 1

## 2023-06-18 MED ORDER — PHENOL 1.4 % MT LIQD: 1.0000 | OROMUCOSAL | Status: DC | PRN

## 2023-06-18 MED ORDER — SURGIPHOR WOUND IRRIGATION SYSTEM - OPTIME
TOPICAL | Status: DC | PRN
  Administered 2023-06-18: 450 mL via TOPICAL

## 2023-06-18 MED ORDER — ACETAMINOPHEN 10 MG/ML IV SOLN: 1000.0000 mg | Freq: Once | INTRAVENOUS | Status: DC | PRN

## 2023-06-18 MED ORDER — BUPIVACAINE HCL (PF) 0.5 % IJ SOLN
INTRAMUSCULAR | Status: AC
  Filled 2023-06-18: qty 10

## 2023-06-18 MED ORDER — PROPOFOL 1000 MG/100ML IV EMUL
INTRAVENOUS | Status: AC
  Filled 2023-06-18: qty 100

## 2023-06-18 MED ORDER — EXENATIDE 10 MCG/0.04ML ~~LOC~~ SOPN: 10.0000 ug | PEN_INJECTOR | Freq: Two times a day (BID) | SUBCUTANEOUS | Status: DC

## 2023-06-18 MED ORDER — BUPIVACAINE HCL (PF) 0.5 % IJ SOLN
INTRAMUSCULAR | Status: DC | PRN
  Administered 2023-06-18: 3 mL

## 2023-06-18 MED ORDER — FENTANYL CITRATE (PF) 100 MCG/2ML IJ SOLN
25.0000 ug | INTRAMUSCULAR | Status: DC | PRN
  Administered 2023-06-18 (×2): 50 ug via INTRAVENOUS

## 2023-06-18 MED ORDER — DEXMEDETOMIDINE HCL IN NACL 80 MCG/20ML IV SOLN
INTRAVENOUS | Status: DC | PRN
  Administered 2023-06-18: 8 ug via INTRAVENOUS
  Administered 2023-06-18 (×2): 4 ug via INTRAVENOUS

## 2023-06-18 MED ORDER — FLEET ENEMA RE ENEM: 1.0000 | ENEMA | Freq: Once | RECTAL | Status: DC | PRN

## 2023-06-18 MED ORDER — ALBUTEROL SULFATE (2.5 MG/3ML) 0.083% IN NEBU: 2.5000 mg | INHALATION_SOLUTION | Freq: Four times a day (QID) | RESPIRATORY_TRACT | Status: DC | PRN

## 2023-06-18 MED ORDER — OXYCODONE HCL 5 MG PO TABS: 10.0000 mg | ORAL_TABLET | ORAL | Status: DC | PRN

## 2023-06-18 MED ORDER — ORAL CARE MOUTH RINSE: 15.0000 mL | OROMUCOSAL | Status: DC | PRN

## 2023-06-18 MED ORDER — SODIUM CHLORIDE 0.9 % IR SOLN
Status: DC | PRN
  Administered 2023-06-18: 3000 mL

## 2023-06-18 MED ORDER — ONDANSETRON HCL 4 MG/2ML IJ SOLN
INTRAMUSCULAR | Status: AC
  Filled 2023-06-18: qty 2

## 2023-06-18 MED ORDER — DEXAMETHASONE SODIUM PHOSPHATE 10 MG/ML IJ SOLN
INTRAMUSCULAR | Status: AC
  Filled 2023-06-18: qty 1

## 2023-06-18 MED ORDER — HYDROMORPHONE HCL 1 MG/ML IJ SOLN
0.5000 mg | INTRAMUSCULAR | Status: DC | PRN
  Administered 2023-06-18: 0.5 mg via INTRAVENOUS
  Filled 2023-06-18: qty 0.5

## 2023-06-18 MED ORDER — ACETAMINOPHEN 10 MG/ML IV SOLN
1000.0000 mg | Freq: Four times a day (QID) | INTRAVENOUS | Status: AC
  Administered 2023-06-18 – 2023-06-19 (×4): 1000 mg via INTRAVENOUS
  Filled 2023-06-18 (×3): qty 100

## 2023-06-18 MED ORDER — LORATADINE 10 MG PO TABS
10.0000 mg | ORAL_TABLET | Freq: Every day | ORAL | Status: DC
  Administered 2023-06-18 – 2023-06-20 (×3): 10 mg via ORAL
  Filled 2023-06-18 (×3): qty 1

## 2023-06-18 MED ORDER — DIPHENHYDRAMINE HCL 12.5 MG/5ML PO ELIX: 12.5000 mg | ORAL_SOLUTION | ORAL | Status: DC | PRN

## 2023-06-18 MED ORDER — HYDROCHLOROTHIAZIDE 25 MG PO TABS
25.0000 mg | ORAL_TABLET | Freq: Every day | ORAL | Status: DC
Start: 2023-06-19 — End: 2023-06-20
  Administered 2023-06-19: 25 mg via ORAL
  Filled 2023-06-18 (×2): qty 1

## 2023-06-18 MED ORDER — DROPERIDOL 2.5 MG/ML IJ SOLN: 0.6250 mg | Freq: Once | INTRAMUSCULAR | Status: DC | PRN

## 2023-06-18 MED ORDER — TRANEXAMIC ACID-NACL 1000-0.7 MG/100ML-% IV SOLN
INTRAVENOUS | Status: AC
  Filled 2023-06-18: qty 100

## 2023-06-18 MED ORDER — CHLORHEXIDINE GLUCONATE 0.12 % MT SOLN
OROMUCOSAL | Status: AC
  Filled 2023-06-18: qty 15

## 2023-06-18 MED ORDER — ONDANSETRON HCL 4 MG/2ML IJ SOLN
INTRAMUSCULAR | Status: DC | PRN
  Administered 2023-06-18: 4 mg via INTRAVENOUS

## 2023-06-18 MED ORDER — POLYVINYL ALCOHOL 1.4 % OP SOLN: 1.0000 [drp] | Freq: Three times a day (TID) | OPHTHALMIC | Status: DC | PRN

## 2023-06-18 MED ORDER — FERROUS SULFATE 325 (65 FE) MG PO TABS
325.0000 mg | ORAL_TABLET | Freq: Two times a day (BID) | ORAL | Status: DC
  Administered 2023-06-18 – 2023-06-19 (×3): 325 mg via ORAL
  Filled 2023-06-18 (×4): qty 1

## 2023-06-18 MED ORDER — ASPIRIN 81 MG PO CHEW
81.0000 mg | CHEWABLE_TABLET | Freq: Two times a day (BID) | ORAL | Status: DC
  Administered 2023-06-18 – 2023-06-20 (×4): 81 mg via ORAL
  Filled 2023-06-18 (×4): qty 1

## 2023-06-18 MED ORDER — FENTANYL CITRATE (PF) 100 MCG/2ML IJ SOLN
INTRAMUSCULAR | Status: DC | PRN
  Administered 2023-06-18 (×2): 25 ug via INTRAVENOUS
  Administered 2023-06-18: 50 ug via INTRAVENOUS

## 2023-06-18 MED ORDER — ALUM & MAG HYDROXIDE-SIMETH 200-200-20 MG/5ML PO SUSP: 30.0000 mL | ORAL | Status: DC | PRN

## 2023-06-18 MED ORDER — CEFAZOLIN SODIUM-DEXTROSE 2-4 GM/100ML-% IV SOLN
2.0000 g | Freq: Four times a day (QID) | INTRAVENOUS | Status: AC
  Administered 2023-06-18 (×2): 2 g via INTRAVENOUS
  Filled 2023-06-18 (×2): qty 100

## 2023-06-18 MED ORDER — BISACODYL 10 MG RE SUPP: 10.0000 mg | Freq: Every day | RECTAL | Status: DC | PRN

## 2023-06-18 MED ORDER — TRAMADOL HCL 50 MG PO TABS
50.0000 mg | ORAL_TABLET | ORAL | Status: DC | PRN
  Administered 2023-06-18 – 2023-06-20 (×2): 100 mg via ORAL
  Filled 2023-06-18 (×2): qty 2

## 2023-06-18 MED ORDER — ONDANSETRON HCL 4 MG/2ML IJ SOLN
4.0000 mg | Freq: Four times a day (QID) | INTRAMUSCULAR | Status: DC | PRN
  Administered 2023-06-19: 4 mg via INTRAVENOUS
  Filled 2023-06-18: qty 2

## 2023-06-18 MED ORDER — BUPIVACAINE HCL (PF) 0.25 % IJ SOLN
INTRAMUSCULAR | Status: DC | PRN
  Administered 2023-06-18: 60 mL

## 2023-06-18 MED ORDER — CELECOXIB 200 MG PO CAPS
ORAL_CAPSULE | ORAL | Status: AC
Start: 2023-06-18 — End: ?
  Filled 2023-06-18: qty 2

## 2023-06-18 MED ORDER — LOSARTAN POTASSIUM-HCTZ 100-25 MG PO TABS: 1.0000 | ORAL_TABLET | Freq: Every morning | ORAL | Status: DC

## 2023-06-18 MED ORDER — PHENYLEPHRINE HCL-NACL 20-0.9 MG/250ML-% IV SOLN
INTRAVENOUS | Status: DC | PRN
  Administered 2023-06-18: 40 ug/min via INTRAVENOUS

## 2023-06-18 MED ORDER — ACETAMINOPHEN 10 MG/ML IV SOLN
INTRAVENOUS | Status: DC | PRN
Start: 2023-06-18 — End: 2023-06-19
  Administered 2023-06-18: 1000 mg via INTRAVENOUS

## 2023-06-18 MED ORDER — OXYCODONE HCL 5 MG PO TABS
5.0000 mg | ORAL_TABLET | ORAL | Status: DC | PRN
  Administered 2023-06-18 – 2023-06-20 (×5): 5 mg via ORAL
  Filled 2023-06-18 (×5): qty 1

## 2023-06-18 MED ORDER — PROPOFOL 500 MG/50ML IV EMUL
INTRAVENOUS | Status: DC | PRN
  Administered 2023-06-18: 125 ug/kg/min via INTRAVENOUS

## 2023-06-18 MED ORDER — MAGNESIUM HYDROXIDE 400 MG/5ML PO SUSP
30.0000 mL | Freq: Every day | ORAL | Status: DC
  Administered 2023-06-18 – 2023-06-20 (×3): 30 mL via ORAL
  Filled 2023-06-18 (×3): qty 30

## 2023-06-18 MED ORDER — ONDANSETRON HCL 4 MG PO TABS: 4.0000 mg | ORAL_TABLET | Freq: Four times a day (QID) | ORAL | Status: DC | PRN

## 2023-06-18 MED ORDER — CELECOXIB 200 MG PO CAPS
200.0000 mg | ORAL_CAPSULE | Freq: Two times a day (BID) | ORAL | Status: DC
  Administered 2023-06-18 – 2023-06-20 (×4): 200 mg via ORAL
  Filled 2023-06-18 (×4): qty 1

## 2023-06-18 MED ORDER — LOSARTAN POTASSIUM 50 MG PO TABS
100.0000 mg | ORAL_TABLET | Freq: Every day | ORAL | Status: DC
  Administered 2023-06-19: 100 mg via ORAL
  Filled 2023-06-18 (×2): qty 2

## 2023-06-18 MED ORDER — SODIUM CHLORIDE 0.9 % IV SOLN: INTRAVENOUS | Status: DC

## 2023-06-18 MED ORDER — SODIUM CHLORIDE 0.9 % IV SOLN
INTRAVENOUS | Status: DC | PRN
  Administered 2023-06-18: 60 mL

## 2023-06-18 MED ORDER — OXYCODONE HCL 5 MG PO TABS: 5.0000 mg | ORAL_TABLET | Freq: Once | ORAL | Status: DC | PRN

## 2023-06-18 MED ORDER — PANTOPRAZOLE SODIUM 40 MG PO TBEC
40.0000 mg | DELAYED_RELEASE_TABLET | Freq: Two times a day (BID) | ORAL | Status: DC
  Administered 2023-06-18 – 2023-06-20 (×4): 40 mg via ORAL
  Filled 2023-06-18 (×4): qty 1

## 2023-06-18 MED ORDER — BUDESON-GLYCOPYRROL-FORMOTEROL 160-9-4.8 MCG/ACT IN AERO
2.0000 | INHALATION_SPRAY | Freq: Two times a day (BID) | RESPIRATORY_TRACT | Status: DC
  Administered 2023-06-18 – 2023-06-20 (×4): 2 via RESPIRATORY_TRACT
  Filled 2023-06-18: qty 5.9

## 2023-06-18 MED ORDER — ACETAMINOPHEN 10 MG/ML IV SOLN
INTRAVENOUS | Status: AC
  Filled 2023-06-18: qty 100

## 2023-06-18 MED ORDER — METOCLOPRAMIDE HCL 10 MG PO TABS
10.0000 mg | ORAL_TABLET | Freq: Three times a day (TID) | ORAL | Status: DC
  Administered 2023-06-18 – 2023-06-20 (×6): 10 mg via ORAL
  Filled 2023-06-18 (×6): qty 1

## 2023-06-18 MED ORDER — ACETAMINOPHEN 325 MG PO TABS: 325.0000 mg | ORAL_TABLET | Freq: Four times a day (QID) | ORAL | Status: DC | PRN

## 2023-06-18 MED ORDER — TRANEXAMIC ACID-NACL 1000-0.7 MG/100ML-% IV SOLN
1000.0000 mg | Freq: Once | INTRAVENOUS | Status: AC
  Administered 2023-06-18: 1000 mg via INTRAVENOUS

## 2023-06-18 MED ORDER — SENNOSIDES-DOCUSATE SODIUM 8.6-50 MG PO TABS
1.0000 | ORAL_TABLET | Freq: Two times a day (BID) | ORAL | Status: DC
  Administered 2023-06-18 – 2023-06-20 (×4): 1 via ORAL
  Filled 2023-06-18 (×4): qty 1

## 2023-06-18 MED ORDER — OXYCODONE HCL 5 MG/5ML PO SOLN: 5.0000 mg | Freq: Once | ORAL | Status: DC | PRN

## 2023-06-18 MED ORDER — MENTHOL 3 MG MT LOZG: 1.0000 | LOZENGE | OROMUCOSAL | Status: DC | PRN

## 2023-06-18 SURGICAL SUPPLY — 70 items
ATTUNE MED DOME PAT 38 KNEE (Knees) IMPLANT
ATTUNE PSFEM LTSZ4 NARCEM KNEE (Femur) IMPLANT
ATTUNE PSRP INSR SZ4 8 KNEE (Insert) IMPLANT
BASE TIBIAL ROT PLAT SZ 3 KNEE (Knees) IMPLANT
BATTERY INSTRU NAVIGATION (MISCELLANEOUS) ×4 IMPLANT
BIT DRILL QUICK REL 1/8 2PK SL (BIT) ×1 IMPLANT
BLADE CLIPPER SURG (BLADE) IMPLANT
BLADE SAW 70X12.5 (BLADE) ×1 IMPLANT
BLADE SAW 90X13X1.19 OSCILLAT (BLADE) ×1 IMPLANT
BLADE SAW 90X25X1.19 OSCILLAT (BLADE) ×1 IMPLANT
BONE CEMENT GENTAMICIN (Cement) ×2 IMPLANT
BRUSH SCRUB EZ PLAIN DRY (MISCELLANEOUS) ×1 IMPLANT
CEMENT BONE GENTAMICIN (Cement) ×2 IMPLANT
CEMENT BONE GENTAMICIN 40 (Cement) IMPLANT
COOLER POLAR GLACIER W/PUMP (MISCELLANEOUS) ×1 IMPLANT
CUFF TRNQT CYL 24X4X16.5-23 (TOURNIQUET CUFF) IMPLANT
CUFF TRNQT CYL 30X4X21-28X (TOURNIQUET CUFF) IMPLANT
DRAPE SHEET LG 3/4 BI-LAMINATE (DRAPES) ×1 IMPLANT
DRSG AQUACEL AG ADV 3.5X10 (GAUZE/BANDAGES/DRESSINGS) IMPLANT
DRSG AQUACEL AG ADV 3.5X14 (GAUZE/BANDAGES/DRESSINGS) ×1 IMPLANT
DRSG MEPILEX SACRM 8.7X9.8 (GAUZE/BANDAGES/DRESSINGS) ×1 IMPLANT
DRSG TEGADERM 4X4.75 (GAUZE/BANDAGES/DRESSINGS) ×1 IMPLANT
DRSG XEROFORM 1X8 (GAUZE/BANDAGES/DRESSINGS) IMPLANT
DURAPREP 26ML APPLICATOR (WOUND CARE) ×2 IMPLANT
ELECT CAUTERY BLADE 6.4 (BLADE) ×1 IMPLANT
ELECT REM PT RETURN 9FT ADLT (ELECTROSURGICAL) ×1 IMPLANT
ELECTRODE REM PT RTRN 9FT ADLT (ELECTROSURGICAL) ×1 IMPLANT
EVACUATOR 1/8 PVC DRAIN (DRAIN) ×1 IMPLANT
EX-PIN ORTHOLOCK NAV 4X150 (PIN) ×2 IMPLANT
GAUZE XEROFORM 1X8 LF (GAUZE/BANDAGES/DRESSINGS) ×1 IMPLANT
GLOVE BIOGEL M STRL SZ7.5 (GLOVE) ×6 IMPLANT
GLOVE SURG UNDER POLY LF SZ8 (GLOVE) ×2 IMPLANT
GOWN STRL REUS W/ TWL LRG LVL3 (GOWN DISPOSABLE) ×1 IMPLANT
GOWN STRL REUS W/ TWL XL LVL3 (GOWN DISPOSABLE) ×1 IMPLANT
GOWN TOGA ZIPPER T7+ PEEL AWAY (MISCELLANEOUS) ×1 IMPLANT
HOLDER FOLEY CATH W/STRAP (MISCELLANEOUS) ×1 IMPLANT
HOOD PEEL AWAY T7 (MISCELLANEOUS) ×1 IMPLANT
KIT TURNOVER KIT A (KITS) ×1 IMPLANT
KNIFE SCULPS 14X20 (INSTRUMENTS) ×1 IMPLANT
MANIFOLD NEPTUNE II (INSTRUMENTS) ×2 IMPLANT
NDL SPNL 20GX3.5 QUINCKE YW (NEEDLE) ×2 IMPLANT
NEEDLE SPNL 20GX3.5 QUINCKE YW (NEEDLE) ×2 IMPLANT
PACK TOTAL KNEE (MISCELLANEOUS) ×1 IMPLANT
PAD ABD DERMACEA PRESS 5X9 (GAUZE/BANDAGES/DRESSINGS) ×2 IMPLANT
PAD ARMBOARD POSITIONER FOAM (MISCELLANEOUS) ×3 IMPLANT
PAD WRAPON POLAR KNEE LONG (MISCELLANEOUS) IMPLANT
PENCIL SMOKE EVACUATOR COATED (MISCELLANEOUS) ×1 IMPLANT
PIN DRILL FIX HALF THREAD (BIT) ×2 IMPLANT
PIN FIXATION 1/8DIA X 3INL (PIN) ×1 IMPLANT
PULSAVAC PLUS IRRIG FAN TIP (DISPOSABLE) ×1 IMPLANT
SOL .9 NS 3000ML IRR UROMATIC (IV SOLUTION) ×1 IMPLANT
SOLUTION IRRIG SURGIPHOR (IV SOLUTION) ×1 IMPLANT
SPONGE DRAIN TRACH 4X4 STRL 2S (GAUZE/BANDAGES/DRESSINGS) ×1 IMPLANT
STAPLER SKIN PROX 35W (STAPLE) ×1 IMPLANT
STOCKINETTE IMPERV 14X48 (MISCELLANEOUS) ×1 IMPLANT
STOCKINETTE STRL BIAS CUT 8X4 (MISCELLANEOUS) ×1 IMPLANT
STRAP TIBIA SHORT (MISCELLANEOUS) ×1 IMPLANT
SUCTION TUBE FRAZIER 10FR DISP (SUCTIONS) ×1 IMPLANT
SUT VIC AB 0 CT1 36 (SUTURE) ×1 IMPLANT
SUT VIC AB 1 CT1 36 (SUTURE) ×2 IMPLANT
SUT VIC AB 2-0 CT2 27 (SUTURE) ×1 IMPLANT
SYR 30ML LL (SYRINGE) ×2 IMPLANT
TIBIAL BASE ROT PLAT SZ 3 KNEE (Knees) ×1 IMPLANT
TIP FAN IRRIG PULSAVAC PLUS (DISPOSABLE) ×1 IMPLANT
TOWEL OR 17X26 4PK STRL BLUE (TOWEL DISPOSABLE) ×1 IMPLANT
TOWER CARTRIDGE SMART MIX (DISPOSABLE) ×1 IMPLANT
TRAP FLUID SMOKE EVACUATOR (MISCELLANEOUS) ×1 IMPLANT
TRAY FOLEY MTR SLVR 16FR STAT (SET/KITS/TRAYS/PACK) ×1 IMPLANT
WATER STERILE IRR 1000ML POUR (IV SOLUTION) ×1 IMPLANT
WRAPON POLAR PAD KNEE LONG (MISCELLANEOUS) ×1 IMPLANT

## 2023-06-18 NOTE — Transfer of Care (Signed)
 Immediate Anesthesia Transfer of Care Note  Patient: Whitney Banks  Procedure(s) Performed: ARTHROPLASTY, KNEE, TOTAL, USING IMAGELESS COMPUTER-ASSISTED NAVIGATION (Left: Knee)  Patient Location: PACU  Anesthesia Type:MAC and Spinal  Level of Consciousness: awake and alert   Airway & Oxygen Therapy: Patient Spontanous Breathing and Patient connected to face mask oxygen  Post-op Assessment: Report given to RN and Post -op Vital signs reviewed and stable  Post vital signs: stable  Last Vitals:  Vitals Value Taken Time  BP    Temp    Pulse 90 06/18/23 1514  Resp 20 06/18/23 1514  SpO2 100 % 06/18/23 1514  Vitals shown include unfiled device data.  Last Pain:  Vitals:   06/18/23 1004  TempSrc: Temporal  PainSc: 0-No pain         Complications: No notable events documented.

## 2023-06-18 NOTE — Anesthesia Procedure Notes (Signed)
 Spinal  Patient location during procedure: OR Start time: 06/18/2023 11:09 AM End time: 06/18/2023 11:12 AM Reason for block: surgical anesthesia Staffing Performed: resident/CRNA  Anesthesiologist: Baltazar Bonier, MD Resident/CRNA: Aniceto Kern, RN Performed by: Aniceto Kern, RN Authorized by: Baltazar Bonier, MD   Preanesthetic Checklist Completed: patient identified, IV checked, site marked, risks and benefits discussed, surgical consent, monitors and equipment checked, pre-op evaluation and timeout performed Spinal Block Patient position: sitting Prep: ChloraPrep Patient monitoring: heart rate, cardiac monitor, continuous pulse ox and blood pressure Approach: midline Location: L3-4 Injection technique: single-shot Needle Needle type: Sprotte and Quincke  Needle gauge: 22 G Needle length: 9 cm Assessment Sensory level: T4 Events: CSF return

## 2023-06-18 NOTE — Interval H&P Note (Signed)
 History and Physical Interval Note:  06/18/2023 10:34 AM  Whitney Banks  has presented today for surgery, with the diagnosis of Primary osteoarthritis of left knee M17.12.  The various methods of treatment have been discussed with the patient and family. After consideration of risks, benefits and other options for treatment, the patient has consented to  Procedure(s): ARTHROPLASTY, KNEE, TOTAL, USING IMAGELESS COMPUTER-ASSISTED NAVIGATION (Left) as a surgical intervention.  The patient's history has been reviewed, patient examined, no change in status, stable for surgery.  I have reviewed the patient's chart and labs.  Questions were answered to the patient's satisfaction.     Bhavik Cabiness P Arjay Jaskiewicz

## 2023-06-18 NOTE — Op Note (Signed)
 OPERATIVE NOTE  DATE OF SURGERY:  06/18/2023  PATIENT NAME:  NORALYN KARIM   DOB: 1950/09/02  MRN: [id number]  PRE-OPERATIVE DIAGNOSIS: Degenerative arthrosis of the left knee, primary  POST-OPERATIVE DIAGNOSIS:  Same  PROCEDURE:  Left total knee arthroplasty using computer-assisted navigation  SURGEON:  Jena Gauss. M.D.  ASSISTANT:  Gean Birchwood, PA-C (present and scrubbed throughout the case, critical for assistance with exposure, retraction, instrumentation, and closure)  ANESTHESIA: spinal  ESTIMATED BLOOD LOSS: 50 mL  FLUIDS REPLACED: 900 mL of crystalloid  TOURNIQUET TIME: 105 minutes  DRAINS: 2 medium Hemovac drains  SOFT TISSUE RELEASES: Anterior cruciate ligament, posterior cruciate ligament, deep medial collateral ligament, patellofemoral ligament  IMPLANTS UTILIZED: DePuy Attune size 4N posterior stabilized femoral component (cemented), size 3 rotating platform tibial component (cemented), 38 mm medialized dome patella (cemented), and an 8 mm stabilized rotating platform polyethylene insert.  INDICATIONS FOR SURGERY: VERNADETTE STUTSMAN is a 73 y.o. year old female with a long history of progressive knee pain. X-rays demonstrated severe degenerative changes in tricompartmental fashion. The patient had not seen any significant improvement despite conservative nonsurgical intervention. After discussion of the risks and benefits of surgical intervention, the patient expressed understanding of the risks benefits and agree with plans for total knee arthroplasty.   The risks, benefits, and alternatives were discussed at length including but not limited to the risks of infection, bleeding, nerve injury, stiffness, blood clots, the need for revision surgery, cardiopulmonary complications, among others, and they were willing to proceed.  PROCEDURE IN DETAIL: The patient was brought into the operating room and, after adequate spinal anesthesia was achieved, a tourniquet  was placed on the patient's upper thigh. The patient's knee and leg were cleaned and prepped with alcohol and DuraPrep and draped in the usual sterile fashion. A "timeout" was performed as per usual protocol. The lower extremity was exsanguinated using an Esmarch, and the tourniquet was inflated to 300 mmHg. An anterior longitudinal incision was made followed by a standard mid vastus approach. The deep fibers of the medial collateral ligament were elevated in a subperiosteal fashion off of the medial flare of the tibia so as to maintain a continuous soft tissue sleeve. The patella was subluxed laterally and the patellofemoral ligament was incised. Inspection of the knee demonstrated severe degenerative changes with full-thickness loss of articular cartilage. Osteophytes were debrided using a rongeur. Anterior and posterior cruciate ligaments were excised. Two 4.0 mm Schanz pins were inserted in the femur and into the tibia for attachment of the array of trackers used for computer-assisted navigation. Hip center was identified using a circumduction technique. Distal landmarks were mapped using the computer. The distal femur and proximal tibia were mapped using the computer. The distal femoral cutting guide was positioned using computer-assisted navigation so as to achieve a 5 distal valgus cut. The femur was sized and it was felt that a size 4N femoral component was appropriate. A size 4 femoral cutting guide was positioned and the anterior cut was performed and verified using the computer. This was followed by completion of the posterior and chamfer cuts. Femoral cutting guide for the central box was then positioned in the center box cut was performed.  Attention was then directed to the proximal tibia. Medial and lateral menisci were excised. The extramedullary tibial cutting guide was positioned using computer-assisted navigation so as to achieve a 0 varus-valgus alignment and 3 posterior slope. The cut was  performed and verified using the computer. The proximal tibia  was sized and it was felt that a size 3 tibial tray was appropriate. Tibial and femoral trials were inserted followed by insertion of an 8 mm polyethylene insert. This allowed for excellent mediolateral soft tissue balancing both in flexion and in full extension. Finally, the patella was cut and prepared so as to accommodate a 38 mm medialized dome patella. A patella trial was placed and the knee was placed through a range of motion with excellent patellar tracking appreciated. The femoral trial was removed after debridement of posterior osteophytes. The central post-hole for the tibial component was reamed followed by insertion of a keel punch. Tibial trials were then removed. Cut surfaces of bone were irrigated with copious amounts of normal saline using pulsatile lavage and then suctioned dry. Polymethylmethacrylate cement with gentamicin was prepared in the usual fashion using a vacuum mixer. Cement was applied to the cut surface of the proximal tibia as well as along the undersurface of a size 3 rotating platform tibial component. Tibial component was positioned and impacted into place. Excess cement was removed using Personal assistant. Cement was then applied to the cut surfaces of the femur as well as along the posterior flanges of the size 4N femoral component. The femoral component was positioned and impacted into place. Excess cement was removed using Personal assistant. An 8 mm polyethylene trial was inserted and the knee was brought into full extension with steady axial compression applied. Finally, cement was applied to the backside of a 38 mm medialized dome patella and the patellar component was positioned and patellar clamp applied. Excess cement was removed using Personal assistant. After adequate curing of the cement, the tourniquet was deflated after a total tourniquet time of 105 minutes. Hemostasis was achieved using electrocautery. The knee  was irrigated with copious amounts of normal saline using pulsatile lavage followed by 450 ml of Surgiphor and then suctioned dry. 20 mL of 1.3% Exparel and 60 mL of 0.25% Marcaine in 40 mL of normal saline was injected along the posterior capsule, medial and lateral gutters, and along the arthrotomy site. An 8 mm stabilized rotating platform polyethylene insert was inserted and the knee was placed through a range of motion with excellent mediolateral soft tissue balancing appreciated and excellent patellar tracking noted. 2 medium drains were placed in the wound bed and brought out through separate stab incisions. The medial parapatellar portion of the incision was reapproximated using interrupted sutures of #1 Vicryl. Subcutaneous tissue was approximated in layers using first #0 Vicryl followed #2-0 Vicryl. The skin was approximated with skin staples. A sterile dressing was applied.  The patient tolerated the procedure well and was transported to the recovery room in stable condition.    Eulala Newcombe P. Haygen Zebrowski, Jr., M.D.

## 2023-06-18 NOTE — Progress Notes (Signed)
 Patient is not able to walk the distance required to go the bathroom, or he/she is unable to safely negotiate stairs required to access the bathroom.  A 3in1 BSC will alleviate this problem   Amenda Duclos P. Angie Fava M.D.

## 2023-06-18 NOTE — Plan of Care (Signed)
  Problem: Education: Goal: Knowledge of the prescribed therapeutic regimen will improve Outcome: Progressing   Problem: Activity: Goal: Ability to avoid complications of mobility impairment will improve Outcome: Progressing Goal: Range of joint motion will improve Outcome: Progressing   Problem: Pain Management: Goal: Pain level will decrease with appropriate interventions Outcome: Progressing   

## 2023-06-18 NOTE — Progress Notes (Signed)
 Subjective: 1 Day Post-Op Procedure(s) (LRB): ARTHROPLASTY, KNEE, TOTAL, USING IMAGELESS COMPUTER-ASSISTED NAVIGATION (Left) Patient reports pain as mild.   Patient seen in rounds with Dr. Aubry Blase. Patient is well, and has had no acute complaints or problems. Denies any CP, SOB, N/V, fevers or chills We will start therapy today.  Plan is to go Home after hospital stay.  Objective: Vital signs in last 24 hours: Temp:  [97.1 F (36.2 C)-98.5 F (36.9 C)] 97.6 F (36.4 C) (04/15 0756) Pulse Rate:  [65-98] 75 (04/15 0756) Resp:  [12-23] 15 (04/15 0756) BP: (108-154)/(60-82) 137/61 (04/15 0756) SpO2:  [97 %-100 %] 99 % (04/15 0756) Weight:  [98.9 kg] 98.9 kg (04/14 1727)  Intake/Output from previous day:  Intake/Output Summary (Last 24 hours) at 06/19/2023 0826 Last data filed at 06/19/2023 0408 Gross per 24 hour  Intake 2852.07 ml  Output 345 ml  Net 2507.07 ml    Intake/Output this shift: No intake/output data recorded.  Labs: No results for input(s): "HGB" in the last 72 hours. No results for input(s): "WBC", "RBC", "HCT", "PLT" in the last 72 hours. No results for input(s): "NA", "K", "CL", "CO2", "BUN", "CREATININE", "GLUCOSE", "CALCIUM" in the last 72 hours. No results for input(s): "LABPT", "INR" in the last 72 hours.  EXAM General - Patient is Alert, Appropriate, and Oriented Extremity - Neurologically intact Neurovascular intact Sensation intact distally Intact pulses distally Dorsiflexion/Plantar flexion intact No cellulitis present Compartment soft Dressing - dressing C/D/I and no drainage Motor Function - intact, moving foot and toes well on exam. JP Drain pulled without difficulty. Intact  Past Medical History:  Diagnosis Date   Alpha thalassemia silent carrier    Anemia    Aortic atherosclerosis (HCC)    CAD (coronary artery disease)    Cerebral microvascular disease    Chronic gastritis    DDD (degenerative disc disease), lumbar    Degenerative  arthritis of knee, bilateral    Diverticulosis    Essential hypertension    Gallstones    GERD (gastroesophageal reflux disease)    Hearing difficulty, right    Hemorrhagic stroke (HCC)    a.) brain MRI 10/09/2019: old hemorrhagic infarction in the RIGHT basal ganglia and corona radiata   HLD (hyperlipidemia)    Moderate persistent asthma without complication    Obesity, Class III, 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y) (HCC)    Pneumonia    PONV (postoperative nausea and vomiting)    Postoperative ileus following knee surgery 2014   Pulmonary hypertension (HCC)    SBO (small bowel obstruction) (HCC)    Status post left cataract extraction    Type 2 diabetes mellitus (HCC)    Wears dentures    full upper, partial lower   Wears hearing aid in both ears     Assessment/Plan: 1 Day Post-Op Procedure(s) (LRB): ARTHROPLASTY, KNEE, TOTAL, USING IMAGELESS COMPUTER-ASSISTED NAVIGATION (Left) Principal Problem:   History of total knee arthroplasty, left  Estimated body mass index is 44.03 kg/m as calculated from the following:   Height as of this encounter: 4\' 11"  (1.499 m).   Weight as of this encounter: 98.9 kg. Advance diet Up with therapy  Patient will continue to work with physical therapy to pass postoperative PT protocols, ROM and strengthening  Discussed with the patient continuing to utilize Polar Care  Patient will use bone foam in 20-30 minute intervals  Patient will wear TED hose bilaterally to help prevent DVT and clot formation  Discussed the Aquacel bandage.  This bandage will stay  in place 7 days postoperatively.  Can be replaced with honeycomb bandages that will be sent home with the patient  Discussed sending the patient home with tramadol and oxycodone for as needed pain management.  Patient will also be sent home with Celebrex to help with swelling and inflammation.  Patient will take an 81 mg aspirin twice daily for DVT prophylaxis  JP drain removed without  difficulty, intact  Weight-Bearing as tolerated to left leg  Patient will follow-up with [facility] in 2 weeks for staple removal and reevaluation  Wadie Guile, PA-C [facility] 06/19/2023, 8:26 AM

## 2023-06-18 NOTE — Progress Notes (Signed)
 PT Cancellation Note  Patient Details Name: MAZI SCHUFF MRN: [id number] DOB: [birth year]   Cancelled Treatment:    Reason Eval/Treat Not Completed: Patient not medically ready: Per nursing pt does not yet have the return of sufficient strength or sensation to safely participate with PT services.  Will attempt to see pt at a future date/time as medically appropriate.   Lavenia Post PT, DPT 06/18/23, 4:09 PM

## 2023-06-19 ENCOUNTER — Encounter: Payer: Self-pay | Admitting: Orthopedic Surgery

## 2023-06-19 DIAGNOSIS — M1712 Unilateral primary osteoarthritis, left knee: Secondary | ICD-10-CM | POA: Diagnosis not present

## 2023-06-19 MED ORDER — CELECOXIB 200 MG PO CAPS: 200.0000 mg | ORAL_CAPSULE | Freq: Two times a day (BID) | ORAL | 1 refills | Status: DC

## 2023-06-19 MED ORDER — ASPIRIN 81 MG PO CHEW: 81.0000 mg | CHEWABLE_TABLET | Freq: Two times a day (BID) | ORAL | Status: DC

## 2023-06-19 MED ORDER — ACETAMINOPHEN 10 MG/ML IV SOLN
INTRAVENOUS | Status: AC
  Filled 2023-06-19: qty 100

## 2023-06-19 MED ORDER — TRAMADOL HCL 50 MG PO TABS: 50.0000 mg | ORAL_TABLET | ORAL | 0 refills | Status: DC | PRN

## 2023-06-19 MED ORDER — OXYCODONE HCL 5 MG PO TABS: 5.0000 mg | ORAL_TABLET | ORAL | 0 refills | Status: DC | PRN

## 2023-06-19 MED ORDER — TRAMADOL HCL 50 MG PO TABS: 50.0000 mg | ORAL_TABLET | ORAL | 0 refills | Status: AC | PRN

## 2023-06-19 MED ORDER — OXYCODONE HCL 5 MG PO TABS: 5.0000 mg | ORAL_TABLET | ORAL | 0 refills | Status: AC | PRN

## 2023-06-19 MED ORDER — ASPIRIN 81 MG PO CHEW: 81.0000 mg | CHEWABLE_TABLET | Freq: Two times a day (BID) | ORAL | Status: AC

## 2023-06-19 MED ORDER — METOCLOPRAMIDE HCL 5 MG/ML IJ SOLN: 10.0000 mg | Freq: Four times a day (QID) | INTRAMUSCULAR | Status: DC | PRN

## 2023-06-19 MED ORDER — CELECOXIB 200 MG PO CAPS: 200.0000 mg | ORAL_CAPSULE | Freq: Two times a day (BID) | ORAL | 1 refills | Status: AC

## 2023-06-19 NOTE — Progress Notes (Signed)
 Occupational Therapy Evaluation Patient Details Name: Whitney Banks MRN: [id number] DOB: [birth year] Today's Date: 06/19/2023   History of Present Illness   ARTHROPLASTY, KNEE, TOTAL, USING IMAGELESS COMPUTER-ASSISTED NAVIGATION (Left: Knee)     Clinical Impressions Pt seen for OT evaluation this date, POD#1 from above surgery, with family at bed side. Pt was requiring assistance in some ADLs prior to surgery (LB dressing, bed mobility), using DME/AE for MOBILITY/ADL due to L knee pain. Pt is eager to return to PLOF with less pain and improved safety and independence. Pt reports her sister is moving in to assist in ADLs/IADLs. Pt currently requires MAX assist for LB dressing while in seated position due to pain and limited AROM of L knee. On initial STS from EOB pt MINA x2 attempts to clear the bed, MINA from toilet seat with use of side rails. Sequncing required for safety and technique. CGA throughout amb, total amb ~15ft with RW. Slow but steady gait with good safety adherence throughout. Pt instructed in polar care mgt, falls prevention strategies, home/routines modifications, DME/AE for LB bathing and dressing tasks, and compression stocking mgt. Pt would benefit from skilled OT services including additional instruction in dressing techniques with or without assistive devices for dressing and bathing skills to support recall and carryover prior to discharge and ultimately to maximize safety, independence, and minimize falls risk and caregiver burden. OT will follow acutely.      If plan is discharge home, recommend the following:   A lot of help with walking and/or transfers;A lot of help with bathing/dressing/bathroom;Assistance with cooking/housework;Assist for transportation;Help with stairs or ramp for entrance     Functional Status Assessment   Patient has had a recent decline in their functional status and demonstrates the ability to make significant improvements in function in  a reasonable and predictable amount of time.     Equipment Recommendations   None recommended by OT     Recommendations for Other Services         Precautions/Restrictions   Precautions Precautions: Knee Restrictions Weight Bearing Restrictions Per Provider Order: No LLE Weight Bearing Per Provider Order: Weight bearing as tolerated     Mobility Bed Mobility Overal bed mobility: Needs Assistance Bed Mobility: Supine to Sit, Sit to Supine     Supine to sit: Used rails, Supervision, HOB elevated (Increased time, no physical assistance) Sit to supine: Min assist, Used rails, HOB elevated (LE assistance when returning to bed)        Transfers Overall transfer level: Needs assistance Equipment used: Rolling walker (2 wheels) Transfers: Sit to/from Stand, Bed to chair/wheelchair/BSC Sit to Stand: Contact guard assist, Min assist           General transfer comment: Initial STS from EOB pt MINA x2 attempts to clear the bed, MINA from toilet seat with use of side rails. Sequncing required for safety and technique. CGA throughout amb, total amb ~40ft with RW. Slow but steady gait with good safety adherence throughout.      Balance Overall balance assessment: Needs assistance Sitting-balance support: Feet supported, No upper extremity supported Sitting balance-Leahy Scale: Good     Standing balance support: During functional activity, Single extremity supported, Reliant on assistive device for balance Standing balance-Leahy Scale: Fair                             ADL either performed or assessed with clinical judgement   ADL Overall ADL's : Needs  assistance/impaired     Grooming: Therapist, nutritional;Wash/dry hands;Contact guard assist;Standing               Lower Body Dressing: Maximal assistance   Toilet Transfer: Contact guard assist;Ambulation;Regular Toilet;Rolling walker (2 wheels);Cueing for sequencing;Cueing for safety;Grab bars    Toileting- Clothing Manipulation and Hygiene: Supervision/safety;Sitting/lateral lean       Functional mobility during ADLs: Contact guard assist;Rolling walker (2 wheels) General ADL Comments: Pt completed LB dressing requiring MAXA due to limited pain/ROM. Amb to BR located in hallway with RW + CGA. Completed toileting with no physical assistance required.     Vision Baseline Vision/History: 1 Wears glasses                         Pertinent Vitals/Pain Pain Assessment Pain Assessment: 0-10 Pain Score: 7  (During mobility) Pain Location: L Knee Pain Descriptors / Indicators: Tender Pain Intervention(s): Repositioned, Premedicated before session, Relaxation     Extremity/Trunk Assessment Upper Extremity Assessment Upper Extremity Assessment: Generalized weakness;Overall WFL for tasks assessed   Lower Extremity Assessment Lower Extremity Assessment: Generalized weakness;LLE deficits/detail LLE Deficits / Details: L TKR   Cervical / Trunk Assessment Cervical / Trunk Assessment: Normal   Communication Communication Communication: No apparent difficulties   Cognition Arousal: Alert Behavior During Therapy: WFL for tasks assessed/performed Cognition: No apparent impairments             OT - Cognition Comments: A/Ox4                 Following commands: Intact       Cueing  General Comments   Cueing Techniques: Verbal cues  Post op dressing D/C/I pre/post session   Exercises Exercises: Other exercises Other Exercises Other Exercises: Edu: compression socks, LB dressing techniques, safe ADL completions, polar care   Shoulder Instructions      Home Living Family/patient expects to be discharged to:: Private residence Living Arrangements: Spouse/significant other Available Help at Discharge: Family;Available 24 hours/day Type of Home: House Home Access: Ramped entrance     Home Layout: Two level;Able to live on main level with  bedroom/bathroom     Bathroom Shower/Tub: Walk-in shower     Bathroom Accessibility: Yes How Accessible: Accessible via walker Home Equipment: Shower seat;Grab bars - toilet;Grab bars - tub/shower;Cane - single Librarian, academic (2 wheels)          Prior Functioning/Environment Prior Level of Function : Independent/Modified Independent;Needs assist;Driving             Mobility Comments: Assist with getting out of bed and LB dressing due to pain PTA ADLs Comments: Assistance with cooking and cleaning from sister who is moving in.    OT Problem List: Decreased strength;Decreased range of motion;Decreased activity tolerance;Impaired balance (sitting and/or standing);Decreased coordination;Decreased safety awareness;Decreased knowledge of use of DME or AE   OT Treatment/Interventions: Self-care/ADL training;Therapeutic exercise;Energy conservation;DME and/or AE instruction;Therapeutic activities;Patient/family education;Balance training      OT Goals(Current goals can be found in the care plan section)   Acute Rehab OT Goals Patient Stated Goal: To return to PLOF OT Goal Formulation: With patient Time For Goal Achievement: 07/03/23 Potential to Achieve Goals: Good ADL Goals Pt Will Perform Grooming: with modified independence;standing Pt Will Perform Lower Body Dressing: with adaptive equipment;with min assist;sit to/from stand Pt Will Transfer to Toilet: ambulating;with modified independence Pt Will Perform Toileting - Clothing Manipulation and hygiene: with modified independence;sitting/lateral leans   OT Frequency:  Min 2X/week  Co-evaluation              AM-PAC OT "6 Clicks" Daily Activity     Outcome Measure Help from another person eating meals?: None Help from another person taking care of personal grooming?: A Little Help from another person toileting, which includes using toliet, bedpan, or urinal?: A Little Help from another person bathing  (including washing, rinsing, drying)?: A Lot Help from another person to put on and taking off regular upper body clothing?: A Little Help from another person to put on and taking off regular lower body clothing?: A Lot 6 Click Score: 17   End of Session Equipment Utilized During Treatment: Gait belt;Rolling walker (2 wheels) Nurse Communication: Mobility status  Activity Tolerance: Patient tolerated treatment well Patient left: in bed;with call bell/phone within reach;with family/visitor present  OT Visit Diagnosis: Unsteadiness on feet (R26.81);Other abnormalities of gait and mobility (R26.89);Muscle weakness (generalized) (M62.81)                Time: 0981-1914 OT Time Calculation (min): 35 min Charges:  OT General Charges $OT Visit: 1 Visit OT Evaluation $OT Eval Low Complexity: 1 Low OT Treatments $Self Care/Home Management : 8-22 mins  Rosaria Common M.S. OTR/L  06/19/23, 9:51 AM

## 2023-06-19 NOTE — Care Management Obs Status (Signed)
 MEDICARE OBSERVATION STATUS NOTIFICATION   Patient Details  Name: JORIE ZEE MRN: [id number] Date of Birth: 03-Mar-1951   Medicare Observation Status Notification Given:  Yes    Anise Kerns 06/19/2023, 3:23 PM

## 2023-06-19 NOTE — TOC Initial Note (Signed)
 Transition of Care [facility]) - Initial/Assessment Note    Patient Details  Name: Whitney Banks MRN: [id number] Date of Birth: 1950-08-07  Transition of Care [facility]) CM/SW Contact:    Alexandra Ice, RN Phone Number: 06/19/2023, 10:51 AM  Clinical Narrative:                 [facility] unable to accept due to out of area, [facility] was able to accept patient.   Expected Discharge Plan: Home w Home Health Services Barriers to Discharge: Barriers Resolved   Patient Goals and CMS Choice            Expected Discharge Plan and Services       Living arrangements for the past 2 months: Single Family Home Expected Discharge Date: 06/19/23                         HH Arranged: PT, OT HH Agency: [facility] Date [facility] Agency Contacted: 06/19/23 Time HH Agency Contacted: 1051 Representative spoke with at [facility] Agency: Alease Hunter  Prior Living Arrangements/Services Living arrangements for the past 2 months: Single Family Home Lives with:: Spouse   Do you feel safe going back to the place where you live?: Yes               Activities of Daily Living   ADL Screening (condition at time of admission) Independently performs ADLs?: Yes (appropriate for developmental age) Is the patient deaf or have difficulty hearing?: Yes Does the patient have difficulty seeing, even when wearing glasses/contacts?: No Does the patient have difficulty concentrating, remembering, or making decisions?: No  Permission Sought/Granted                  Emotional Assessment              Admission diagnosis:  Primary osteoarthritis of left knee [M17.12] History of total knee arthroplasty, left [Z96.652] Patient Active Problem List   Diagnosis Date Noted   History of total knee arthroplasty, left 06/18/2023   Alpha thalassemia silent carrier 12/26/2022   Moderate persistent asthma without complication 07/11/2022   Cough, unspecified 03/23/2022   Ear pain, right 01/12/2022    Mucopurulent chronic bronchitis (HCC) 01/12/2022   Overactive bladder 08/08/2021   Allergic rhinitis 06/13/2021   Cerebral ischemia 10/20/2019   Anemia, unspecified 09/16/2018   Leg pain, right 08/17/2017   Morbid obesity (HCC) 01/03/2017   Pain in left knee 07/21/2016   Chronic kidney disease, stage III (moderate) (HCC) 06/19/2016   Muscle cramps 06/05/2016   Eustachian tube dysfunction, bilateral 12/17/2015   Abnormal mammogram 04/30/2015   Atopic dermatitis, unspecified 03/23/2015   Disorder resulting from impaired renal function 10/18/2012   Dyspnea 10/08/2012   Essential hypertension 10/08/2012   Pulmonary hypertension (HCC) 10/08/2012   Hx of total knee replacement, right 09/18/2012   Lung mass 09/18/2012   Knee pain, chronic 06/04/2012   Menopausal syndrome 04/16/2007   PCP:  Macie Saxon, MD Pharmacy:   [facility] - [place], [place] - [address] [place] [address] Phone: [phone] Fax: [phone]  [facility] - [place], [place] - [address] [facility] [address] [employer] [place] [address] Phone: [phone] Fax: [phone]     Social Drivers of Health (SDOH) Social History: SDOH Screenings   Food Insecurity: No Food Insecurity (06/18/2023)  Housing: Low Risk  (06/18/2023)  Transportation Needs: No Transportation Needs (06/18/2023)  Utilities: Not At  Risk (06/18/2023)  Depression (PHQ2-9): Low Risk  (11/13/2022)  Financial Resource Strain: Low Risk  (04/09/2023)   Received from [facility]  Social Connections: Socially Integrated (06/18/2023)  Tobacco Use: Low Risk  (06/18/2023)  Recent Concern: Tobacco Use - Medium Risk (05/28/2023)   Received from [facility]   SDOH Interventions:     Readmission Risk Interventions     No data to display

## 2023-06-19 NOTE — Plan of Care (Signed)
   Problem: Activity: Goal: Ability to avoid complications of mobility impairment will improve Outcome: Progressing   Problem: Pain Management: Goal: Pain level will decrease with appropriate interventions Outcome: Progressing

## 2023-06-19 NOTE — Evaluation (Signed)
 Physical Therapy Evaluation Patient Details Name: Whitney Banks MRN: [id number] DOB: 07-10-1950 Today's Date: 06/19/2023  History of Present Illness  Pt is a 73 yo F diagnosed with degenerative arthrosis of the left knee and is s/p elective L TKA.  PMH includes HTN, HLD, R TKA, CAD, DM, and pulmonary HTN.  Clinical Impression  Pt was pleasant and motivated to participate during the session and put forth good effort throughout. Pt required extra time and effort with bed mobility tasks and transfers along with cuing for sequencing but needed no physical assistance.  Once in standing pt was moderately antalgic with WB on the LLE with subjective report of feeling "weak" in her L knee.  Pt able to perform marching at the EOB with no buckling and was trained on step-to sequencing to address pain and weakness.  Pt able to amb a self-selected max of around 5 feet with the RW with step-to sequencing before needing to return to sitting secondary to fatigue.  Pt reported no adverse symptoms during the session other than L knee pain with SpO2 and HR WNL throughout on room air. Pt will benefit from continued PT services upon discharge to safely address deficits listed in patient problem list for decreased caregiver assistance and eventual return to PLOF.           If plan is discharge home, recommend the following: A lot of help with walking and/or transfers;A little help with bathing/dressing/bathroom;Assistance with cooking/housework;Direct supervision/assist for medications management;Help with stairs or ramp for entrance;Assist for transportation   Can travel by private vehicle        Equipment Recommendations BSC/3in1  Recommendations for Other Services       Functional Status Assessment Patient has had a recent decline in their functional status and demonstrates the ability to make significant improvements in function in a reasonable and predictable amount of time.     Precautions /  Restrictions Precautions Precautions: Knee Restrictions Weight Bearing Restrictions Per Provider Order: Yes LLE Weight Bearing Per Provider Order: Weight bearing as tolerated      Mobility  Bed Mobility Overal bed mobility: Needs Assistance       Supine to sit: Supervision     General bed mobility comments: Extra time and effort along with use of the bed rail    Transfers Overall transfer level: Needs assistance Equipment used: Rolling walker (2 wheels) Transfers: Sit to/from Stand Sit to Stand: Contact guard assist, From elevated surface           General transfer comment: Mod verbal cues for hand and L foot positioning during sit to/from stand with pt requiring extra time and effort to come to standing but no physical assist    Ambulation/Gait Ambulation/Gait assistance: Contact guard assist Gait Distance (Feet): 5 Feet Assistive device: Rolling walker (2 wheels) Gait Pattern/deviations: Step-to pattern, Antalgic, Decreased stance time - left, Decreased step length - right Gait velocity: decreased     General Gait Details: Step-to sequencing education provided to address L knee pain with WB along with pt reported LLE weakness  Stairs            Wheelchair Mobility     Tilt Bed    Modified Rankin (Stroke Patients Only)       Balance Overall balance assessment: Needs assistance Sitting-balance support: No upper extremity supported, Feet unsupported Sitting balance-Leahy Scale: Normal     Standing balance support: During functional activity, Reliant on assistive device for balance, Bilateral upper extremity supported Standing balance-Leahy  Scale: Fair                               Pertinent Vitals/Pain Pain Assessment Pain Assessment: 0-10 Pain Score: 5  Pain Location: L Knee Pain Descriptors / Indicators: Sore Pain Intervention(s): Premedicated before session, Repositioned, Ice applied, Monitored during session    Home Living  Family/patient expects to be discharged to:: Private residence Living Arrangements: Spouse/significant other (sister moving in to assist) Available Help at Discharge: Family;Available 24 hours/day Type of Home: House Home Access: Ramped entrance       Home Layout: Two level;Able to live on main level with bedroom/bathroom Home Equipment: Shower seat;Grab bars - toilet;Grab bars - tub/shower;Cane - single Librarian, academic (2 wheels)      Prior Function Prior Level of Function : Independent/Modified Independent;Needs assist;Driving             Mobility Comments: Ind amb in the home without an AD, SPC for limited community distances, no fall history ADLs Comments: Assistance with cooking and cleaning from sister who is moving in     Extremity/Trunk Assessment   Upper Extremity Assessment Upper Extremity Assessment: Overall WFL for tasks assessed    Lower Extremity Assessment Lower Extremity Assessment: Generalized weakness;LLE deficits/detail LLE Deficits / Details: L hip flex strength >/= 3/5 with pt able to perform Ind SLR without extensor lag LLE Sensation: WNL LLE Coordination: WNL    Cervical / Trunk Assessment Cervical / Trunk Assessment: Normal  Communication   Communication Communication: No apparent difficulties    Cognition Arousal: Alert Behavior During Therapy: WFL for tasks assessed/performed   PT - Cognitive impairments: No apparent impairments                         Following commands: Intact       Cueing Cueing Techniques: Verbal cues, Visual cues, Tactile cues     General Comments General comments (skin integrity, edema, etc.): Post op dressing D/C/I pre/post session    Exercises Total Joint Exercises Quad Sets: AROM, Strengthening, Left, 10 reps Straight Leg Raises: AROM, Strengthening, Left, 5 reps Long Arc Quad: AROM, Strengthening, Left, 10 reps Knee Flexion: AROM, Strengthening, Left, 10 reps Goniometric ROM: L knee  AROM: 0-76 deg Marching in Standing: AROM, Strengthening, Both, 5 reps, Standing Other Exercises: HEP education for LLE QS and seated knee flex Other Exercises: LLE positioning education to promote knee ext PROM and prevent heel pressure   Assessment/Plan    PT Assessment Patient needs continued PT services  PT Problem List Decreased strength;Decreased range of motion;Decreased activity tolerance;Decreased balance;Decreased mobility;Decreased knowledge of use of DME;Pain       PT Treatment Interventions DME instruction;Gait training;Therapeutic activities;Functional mobility training;Therapeutic exercise;Balance training;Patient/family education    PT Goals (Current goals can be found in the Care Plan section)  Acute Rehab PT Goals Patient Stated Goal: To walk without pain and without being tired PT Goal Formulation: With patient Time For Goal Achievement: 07/02/23 Potential to Achieve Goals: Good    Frequency BID     Co-evaluation               AM-PAC PT "6 Clicks" Mobility  Outcome Measure Help needed turning from your back to your side while in a flat bed without using bedrails?: A Little Help needed moving from lying on your back to sitting on the side of a flat bed without using bedrails?: A Little Help  needed moving to and from a bed to a chair (including a wheelchair)?: A Little Help needed standing up from a chair using your arms (e.g., wheelchair or bedside chair)?: A Little Help needed to walk in hospital room?: A Lot Help needed climbing 3-5 steps with a railing? : Total 6 Click Score: 15    End of Session Equipment Utilized During Treatment: Gait belt Activity Tolerance: Patient tolerated treatment well Patient left: in chair;with call bell/phone within reach;with family/visitor present;with SCD's reapplied;Other (comment) (polar care donned to L knee) Nurse Communication: Mobility status;Weight bearing status PT Visit Diagnosis: Other abnormalities of gait  and mobility (R26.89);Muscle weakness (generalized) (M62.81);Pain Pain - Right/Left: Left Pain - part of body: Knee    Time: 1610-9604 PT Time Calculation (min) (ACUTE ONLY): 32 min   Charges:   PT Evaluation $PT Eval Moderate Complexity: 1 Mod PT Treatments $Therapeutic Exercise: 8-22 mins PT General Charges $$ ACUTE PT VISIT: 1 Visit       D. Madalyn Scarce PT, DPT 06/19/23, 11:44 AM

## 2023-06-19 NOTE — Anesthesia Postprocedure Evaluation (Signed)
 Anesthesia Post Note  Patient: Whitney Banks  Procedure(s) Performed: ARTHROPLASTY, KNEE, TOTAL, USING IMAGELESS COMPUTER-ASSISTED NAVIGATION (Left: Knee)  Patient location during evaluation: Nursing Unit Anesthesia Type: Spinal Level of consciousness: oriented and awake and alert Pain management: pain level controlled Vital Signs Assessment: post-procedure vital signs reviewed and stable Respiratory status: spontaneous breathing and respiratory function stable Cardiovascular status: blood pressure returned to baseline and stable Postop Assessment: no headache, no backache, no apparent nausea or vomiting and patient able to bend at knees Anesthetic complications: no  No notable events documented.   Last Vitals:  Vitals:   06/18/23 2332 06/19/23 0342  BP: (!) 140/79 (!) 154/79  Pulse: 86 79  Resp: 15 20  Temp: 36.9 C 36.5 C  SpO2: 97% 99%    Last Pain:  Vitals:   06/19/23 0342  TempSrc: Temporal  PainSc:                  [name]

## 2023-06-19 NOTE — Progress Notes (Signed)
 Physical Therapy Treatment Patient Details Name: Whitney Banks MRN: [id number] DOB: Jul 25, 1950 Today's Date: 06/19/2023   History of Present Illness Pt is a 73 yo F diagnosed with degenerative arthrosis of the left knee and is s/p elective L TKA.  PMH includes HTN, HLD, R TKA, CAD, DM, and pulmonary HTN.    PT Comments  Pt was pleasant and motivated to participate during the session and put forth good effort throughout. Pt continued to require extra time, effort, and the use of the bed rails during sup to/from sit but no physical assist.  Pt required cuing for proper sequencing with transfers along with extra time and effort to come to standing with pt demonstrating good eccentric control during stand to sit.  Pt was able to increase her max amb distance to 30 feet this session but continued with step-to pattern to address L knee pain with weight bearing.  Pt was generally steady with standing activities with no overt LOB or L knee buckling but did present with some nausea after gait that resolved quickly once pt was back at rest, nursing notified.  Pt will benefit from continued PT services upon discharge to safely address deficits listed in patient problem list for decreased caregiver assistance and eventual return to PLOF.      If plan is discharge home, recommend the following: A lot of help with walking and/or transfers;A little help with bathing/dressing/bathroom;Assistance with cooking/housework;Direct supervision/assist for medications management;Help with stairs or ramp for entrance;Assist for transportation   Can travel by private vehicle        Equipment Recommendations  BSC/3in1    Recommendations for Other Services       Precautions / Restrictions Precautions Precautions: Knee Restrictions Weight Bearing Restrictions Per Provider Order: Yes LLE Weight Bearing Per Provider Order: Weight bearing as tolerated     Mobility  Bed Mobility Overal bed mobility: Needs  Assistance Bed Mobility: Supine to Sit, Sit to Supine     Supine to sit: Supervision Sit to supine: Supervision   General bed mobility comments: Extra time and effort along with use of the bed rail    Transfers Overall transfer level: Needs assistance Equipment used: Rolling walker (2 wheels) Transfers: Sit to/from Stand Sit to Stand: Contact guard assist, From elevated surface           General transfer comment: Min verbal cues for hand and L foot positioning during sit to/from stand with pt requiring extra time and effort to come to standing but no physical assist    Ambulation/Gait Ambulation/Gait assistance: Contact guard assist Gait Distance (Feet): 30 Feet Assistive device: Rolling walker (2 wheels) Gait Pattern/deviations: Step-to pattern, Antalgic, Decreased stance time - left, Decreased step length - right Gait velocity: decreased     General Gait Details: Step-to sequencing education/review provided to address L knee pain with WB   Stairs             Wheelchair Mobility     Tilt Bed    Modified Rankin (Stroke Patients Only)       Balance Overall balance assessment: Needs assistance Sitting-balance support: No upper extremity supported, Feet unsupported Sitting balance-Leahy Scale: Normal     Standing balance support: During functional activity, Reliant on assistive device for balance, Bilateral upper extremity supported Standing balance-Leahy Scale: Fair                              Musician Communication: No  apparent difficulties  Cognition Arousal: Alert Behavior During Therapy: WFL for tasks assessed/performed   PT - Cognitive impairments: No apparent impairments                         Following commands: Intact      Cueing Cueing Techniques: Verbal cues, Visual cues, Tactile cues  Exercises Total Joint Exercises Quad Sets: AROM, Strengthening, Left, 10 reps Straight Leg Raises: AROM,  Strengthening, Left, 5 reps Long Arc Quad: AROM, Strengthening, Left, 10 reps Knee Flexion: AROM, Strengthening, Left, 10 reps Goniometric ROM: L knee AROM: 0-76 deg Marching in Standing: AROM, Strengthening, Both, 5 reps, Standing Other Exercises Other Exercises: LLE positioning education/review to promote knee ext PROM and prevent heel pressure Other Exercises: HEP education per handout    General Comments        Pertinent Vitals/Pain Pain Assessment Pain Assessment: 0-10 Pain Score: 5  Pain Location: L Knee Pain Descriptors / Indicators: Sore Pain Intervention(s): Repositioned, Premedicated before session, Ice applied, Monitored during session    Home Living                          Prior Function            PT Goals (current goals can now be found in the care plan section) Acute Rehab PT Goals Patient Stated Goal: To walk without pain and without being tired PT Goal Formulation: With patient Time For Goal Achievement: 07/02/23 Potential to Achieve Goals: Good Progress towards PT goals: Progressing toward goals    Frequency    BID      PT Plan      Co-evaluation              AM-PAC PT "6 Clicks" Mobility   Outcome Measure  Help needed turning from your back to your side while in a flat bed without using bedrails?: A Little Help needed moving from lying on your back to sitting on the side of a flat bed without using bedrails?: A Little Help needed moving to and from a bed to a chair (including a wheelchair)?: A Little Help needed standing up from a chair using your arms (e.g., wheelchair or bedside chair)?: A Little Help needed to walk in hospital room?: A Little Help needed climbing 3-5 steps with a railing? : A Lot 6 Click Score: 17    End of Session Equipment Utilized During Treatment: Gait belt Activity Tolerance: Other (comment) (Somewhat limited by nausea and L knee pain) Patient left: in bed;with call bell/phone within reach;with  bed alarm set;with family/visitor present;with SCD's reapplied;Other (comment) (polar care donned to L knee) Nurse Communication: Mobility status;Weight bearing status;Other (comment) (pt nauseous after ambulation) PT Visit Diagnosis: Other abnormalities of gait and mobility (R26.89);Muscle weakness (generalized) (M62.81);Pain Pain - Right/Left: Left Pain - part of body: Knee     Time: 1610-9604 PT Time Calculation (min) (ACUTE ONLY): 26 min  Charges:    $Gait Training: 8-22 mins $Therapeutic Exercise: 8-22 mins PT General Charges $$ ACUTE PT VISIT: 1 Visit                     D. Scott Kingjames Coury PT, DPT 06/19/23, 3:38 PM

## 2023-06-19 NOTE — Discharge Summary (Cosign Needed)
 Physician Discharge Summary  Subjective: 2 Days Post-Op Procedure(s) (LRB): ARTHROPLASTY, KNEE, TOTAL, USING IMAGELESS COMPUTER-ASSISTED NAVIGATION (Left) Patient reports pain as mild.   Patient seen in rounds with Dr. Aubry Blase. Patient is well, and has had no acute complaints or problems. Denies any CP, SOB, N/V, fevers or chills We will continue with therapy today to pass protocols.  Patient is ready to go home  Physician Discharge Summary  Patient ID: Whitney Banks MRN: [id number] DOB/AGE: [birth year] 73 y.o.  Admit date: 06/18/2023 Discharge date: 06/20/2023  Admission Diagnoses:  Discharge Diagnoses:  Principal Problem:   History of total knee arthroplasty, left   Discharged Condition: good  Hospital Course: Patient presented to the hospital on/14/2025 for an elective left total knee arthroplasty performed by Dr. Aubry Blase. Patient was given 1g of TXA and 2g of Ancef prior to the procedure. she tolerated the procedure well without any complications. See procedural note below for details. Postoperatively, the patient did okay. She struggled to pass her PT protocols first day after surgery and stayed one extra night to PO day 2. Was able to pass PT protocols on post-op day two. JP drain was removed on PO day 1 without any difficulty and was intact. she was able to void her bladder without any difficulty. Physical exam was unremarkable. she denies any SOB, CP, N/V, fevers or chills. Vital signs are stable. Patient is stable to discharge home.  PROCEDURE:  Left total knee arthroplasty using computer-assisted navigation   SURGEON:  Maxene Span. M.D.   ASSISTANT:  Benjiman Bras, PA-C (present and scrubbed throughout the case, critical for assistance with exposure, retraction, instrumentation, and closure)   ANESTHESIA: spinal   ESTIMATED BLOOD LOSS: 50 mL   FLUIDS REPLACED: 900 mL of crystalloid   TOURNIQUET TIME: 105 minutes   DRAINS: 2 medium Hemovac drains   SOFT  TISSUE RELEASES: Anterior cruciate ligament, posterior cruciate ligament, deep medial collateral ligament, patellofemoral ligament   IMPLANTS UTILIZED: DePuy Attune size 4N posterior stabilized femoral component (cemented), size 3 rotating platform tibial component (cemented), 38 mm medialized dome patella (cemented), and an 8 mm stabilized rotating platform polyethylene insert.  Treatments: None  Discharge Exam: Blood pressure (!) 98/57, pulse 85, temperature 98 F (36.7 C), resp. rate 16, height 4\' 11"  (1.499 m), weight 98.9 kg, SpO2 100%.   Disposition: Discharge disposition: 01-Home or Self Care       Allergies as of 06/20/2023       Reactions   Lovastatin Other (See Comments)   muscle cramps   Penicillins Hives   Lisinopril Cough        Medication List     TAKE these medications    albuterol 108 (90 Base) MCG/ACT inhaler Commonly known as: VENTOLIN HFA Inhale 1-2 puffs into the lungs every 6 (six) hours as needed for wheezing or shortness of breath.   ascorbic acid 500 MG tablet Commonly known as: VITAMIN C Take 500 mg by mouth in the morning.   aspirin 81 MG chewable tablet Chew 1 tablet (81 mg total) by mouth 2 (two) times daily.   Byetta 10 MCG Pen 10 MCG/0.04ML Sopn injection Generic drug: exenatide Inject 10 mcg into the skin 2 (two) times daily with a meal.   carboxymethylcellulose 0.5 % Soln Commonly known as: REFRESH PLUS Place 1-2 drops into both eyes 3 (three) times daily as needed (dry/irritated eyes.).   celecoxib 200 MG capsule Commonly known as: CELEBREX Take 1 capsule (200 mg total) by mouth 2 (two)  times daily.   cetirizine 10 MG tablet Commonly known as: ZYRTEC Take 10 mg by mouth in the morning.   COD LIVER OIL PO Take 1 capsule by mouth in the morning.   losartan-hydrochlorothiazide 100-25 MG tablet Commonly known as: HYZAAR Take 1 tablet by mouth in the morning.   OVER THE COUNTER MEDICATION Take 1 capsule by mouth in the  morning. SEA MOSS   oxyCODONE 5 MG immediate release tablet Commonly known as: Oxy IR/ROXICODONE Take 1 tablet (5 mg total) by mouth every 4 (four) hours as needed for moderate pain (pain score 4-6) (pain score 4-6).   pantoprazole 40 MG tablet Commonly known as: PROTONIX Take 40 mg by mouth in the morning and at bedtime.   traMADol 50 MG tablet Commonly known as: ULTRAM Take 1-2 tablets (50-100 mg total) by mouth every 4 (four) hours as needed for moderate pain (pain score 4-6).   Trelegy Ellipta 200-62.5-25 MCG/ACT Aepb Generic drug: Fluticasone-Umeclidin-Vilant Inhale 1 puff into the lungs daily.   VITAMIN A PO Take 1 capsule by mouth in the morning.   VITAMIN B-12 SL Take 1 tablet by mouth in the morning.   VITAMIN D-3 PO Take 1 tablet by mouth in the morning.   ZINC PO Take 1 tablet by mouth in the morning.               Durable Medical Equipment  (From admission, onward)           Start     Ordered   06/18/23 1726  DME Walker rolling  Once       Question:  Patient needs a walker to treat with the following condition  Answer:  Total knee replacement status   06/18/23 1726   06/18/23 1726  DME Bedside commode  Once       Comments: Patient is not able to walk the distance required to go the bathroom, or he/she is unable to safely negotiate stairs required to access the bathroom.  A 3in1 BSC will alleviate this problem  Question:  Patient needs a bedside commode to treat with the following condition  Answer:  Total knee replacement status   06/18/23 1726            Follow-up Information     Wadie Guile, PA-C Follow up on 07/03/2023.   Specialty: Orthopedic Surgery Why: at 9:15am Contact information: [address] [place] [address] [phone]         Arlyne Lame, MD Follow up on 07/31/2023.   Specialty: Orthopedic Surgery Why: at 3:45pm Contact information: [address] [facility] [place]  [address] [phone]                 Signed: Benjiman Bras 06/20/2023, 7:28 AM   Objective: Vital signs in last 24 hours: Temp:  [97.6 F (36.4 C)-98.5 F (36.9 C)] 98 F (36.7 C) (04/16 2841) Pulse Rate:  [75-92] 85 (04/16 0632) Resp:  [15-18] 16 (04/16 0632) BP: (98-148)/(57-75) 98/57 (04/16 3244) SpO2:  [95 %-100 %] 100 % (04/16 0102)  Intake/Output from previous day: No intake or output data in the 24 hours ending 06/20/23 0728   Intake/Output this shift: No intake/output data recorded.  Labs: No results for input(s): "HGB" in the last 72 hours. No results for input(s): "WBC", "RBC", "HCT", "PLT" in the last 72 hours. No results for input(s): "NA", "K", "CL", "CO2", "BUN", "CREATININE", "GLUCOSE", "CALCIUM" in the last 72 hours. No results for  input(s): "LABPT", "INR" in the last 72 hours.  EXAM: General - Patient is Alert, Appropriate, and Oriented Extremity - Neurologically intact Neurovascular intact Sensation intact distally Intact pulses distally Dorsiflexion/Plantar flexion intact No cellulitis present Compartment soft Dressing - dressing C/D/I and no drainage Motor Function - intact, moving foot and toes well on exam. JP Drain pulled without difficulty. Intact  Assessment/Plan: 2 Days Post-Op Procedure(s) (LRB): ARTHROPLASTY, KNEE, TOTAL, USING IMAGELESS COMPUTER-ASSISTED NAVIGATION (Left) Procedure(s) (LRB): ARTHROPLASTY, KNEE, TOTAL, USING IMAGELESS COMPUTER-ASSISTED NAVIGATION (Left) Past Medical History:  Diagnosis Date   Alpha thalassemia silent carrier    Anemia    Aortic atherosclerosis (HCC)    CAD (coronary artery disease)    Cerebral microvascular disease    Chronic gastritis    DDD (degenerative disc disease), lumbar    Degenerative arthritis of knee, bilateral    Diverticulosis    Essential hypertension    Gallstones    GERD (gastroesophageal reflux disease)    Hearing difficulty, right    Hemorrhagic stroke (HCC)    a.)  brain MRI 10/09/2019: old hemorrhagic infarction in the RIGHT basal ganglia and corona radiata   HLD (hyperlipidemia)    Moderate persistent asthma without complication    Obesity, Class III, 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y) (HCC)    Pneumonia    PONV (postoperative nausea and vomiting)    Postoperative ileus following knee surgery 2014   Pulmonary hypertension (HCC)    SBO (small bowel obstruction) (HCC)    Status post left cataract extraction    Type 2 diabetes mellitus (HCC)    Wears dentures    full upper, partial lower   Wears hearing aid in both ears    Principal Problem:   History of total knee arthroplasty, left  Estimated body mass index is 44.03 kg/m as calculated from the following:   Height as of this encounter: 4\' 11"  (1.499 m).   Weight as of this encounter: 98.9 kg.  Patient will continue to work with physical therapy, has now passed her protocols   Discussed with the patient continuing to utilize Polar Care   Patient will use bone foam in 20-30 minute intervals   Patient will wear TED hose bilaterally to help prevent DVT and clot formation   Discussed the Aquacel bandage.  This bandage will stay in place 7 days postoperatively.  Can be replaced with honeycomb bandages that will be sent home with the patient   Discussed sending the patient home with tramadol and oxycodone for as needed pain management.  Patient will also be sent home with Celebrex to help with swelling and inflammation.  Patient will take an 81 mg aspirin twice daily for DVT prophylaxis   JP drain removed without difficulty, intact   Weight-Bearing as tolerated to left leg   Patient will follow-up with [facility] orthopedics in 2 weeks for staple removal and reevaluation  Diet - Regular diet Follow up - in 2 weeks Activity - WBAT Disposition - Home Condition Upon Discharge - Good DVT Prophylaxis - Aspirin and TED hose  Standley Earing, PA-C Orthopaedic Surgery 06/20/2023, 7:28  AM

## 2023-06-19 NOTE — Plan of Care (Signed)
  Problem: Education: Goal: Knowledge of the prescribed therapeutic regimen will improve Outcome: Progressing   Problem: Activity: Goal: Ability to avoid complications of mobility impairment will improve Outcome: Progressing   Problem: Clinical Measurements: Goal: Postoperative complications will be avoided or minimized Outcome: Progressing   Problem: Skin Integrity: Goal: Will show signs of wound healing Outcome: Progressing   

## 2023-06-20 DIAGNOSIS — M1712 Unilateral primary osteoarthritis, left knee: Secondary | ICD-10-CM | POA: Diagnosis not present

## 2023-06-20 MED ORDER — SODIUM CHLORIDE 0.9 % IV BOLUS
500.0000 mL | Freq: Once | INTRAVENOUS | Status: AC
  Administered 2023-06-20: 500 mL via INTRAVENOUS

## 2023-06-20 NOTE — Progress Notes (Signed)
 Pt dc with IV removed and dc education given. Pt received polar care, bone foam, incentive spirometer, medication scripts, and both TED hose on and in place. Pt wheeled down to medical mall entrance by staff and pt's sister provided transportation.

## 2023-06-20 NOTE — Plan of Care (Signed)
   Problem: Pain Management: Goal: Pain level will decrease with appropriate interventions Outcome: Progressing

## 2023-06-20 NOTE — Plan of Care (Signed)
  Problem: Education: Goal: Knowledge of the prescribed therapeutic regimen will improve Outcome: Progressing   Problem: Activity: Goal: Ability to avoid complications of mobility impairment will improve Outcome: Progressing   Problem: Education: Goal: Knowledge of General Education information will improve Description: Including pain rating scale, medication(s)/side effects and non-pharmacologic comfort measures Outcome: Progressing   Problem: Activity: Goal: Risk for activity intolerance will decrease Outcome: Progressing

## 2023-06-20 NOTE — Progress Notes (Signed)
 Subjective: 2 Days Post-Op Procedure(s) (LRB): ARTHROPLASTY, KNEE, TOTAL, USING IMAGELESS COMPUTER-ASSISTED NAVIGATION (Left) Patient reports pain as mild.   Patient seen in rounds with Dr. Aubry Blase. Patient is well, and has had no acute complaints or problems. Denies any CP, SOB, N/V, fevers or chills We will start therapy today.  Plan is to go Home after hospital stay.  Objective: Vital signs in last 24 hours: Temp:  [97.6 F (36.4 C)-98.5 F (36.9 C)] 98 F (36.7 C) (04/16 1478) Pulse Rate:  [75-92] 85 (04/16 0632) Resp:  [15-18] 16 (04/16 0632) BP: (98-148)/(57-75) 98/57 (04/16 2956) SpO2:  [95 %-100 %] 100 % (04/16 2130)  Intake/Output from previous day: No intake or output data in the 24 hours ending 06/20/23 0726   Intake/Output this shift: No intake/output data recorded.  Labs: No results for input(s): "HGB" in the last 72 hours. No results for input(s): "WBC", "RBC", "HCT", "PLT" in the last 72 hours. No results for input(s): "NA", "K", "CL", "CO2", "BUN", "CREATININE", "GLUCOSE", "CALCIUM" in the last 72 hours. No results for input(s): "LABPT", "INR" in the last 72 hours.  EXAM General - Patient is Alert, Appropriate, and Oriented Extremity - Neurologically intact Neurovascular intact Sensation intact distally Intact pulses distally Dorsiflexion/Plantar flexion intact No cellulitis present Compartment soft Dressing - dressing C/D/I and no drainage Motor Function - intact, moving foot and toes well on exam. JP Drain pulled without difficulty. Intact  Past Medical History:  Diagnosis Date   Alpha thalassemia silent carrier    Anemia    Aortic atherosclerosis (HCC)    CAD (coronary artery disease)    Cerebral microvascular disease    Chronic gastritis    DDD (degenerative disc disease), lumbar    Degenerative arthritis of knee, bilateral    Diverticulosis    Essential hypertension    Gallstones    GERD (gastroesophageal reflux disease)    Hearing  difficulty, right    Hemorrhagic stroke (HCC)    a.) brain MRI 10/09/2019: old hemorrhagic infarction in the RIGHT basal ganglia and corona radiata   HLD (hyperlipidemia)    Moderate persistent asthma without complication    Obesity, Class III, 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y) (HCC)    Pneumonia    PONV (postoperative nausea and vomiting)    Postoperative ileus following knee surgery 2014   Pulmonary hypertension (HCC)    SBO (small bowel obstruction) (HCC)    Status post left cataract extraction    Type 2 diabetes mellitus (HCC)    Wears dentures    full upper, partial lower   Wears hearing aid in both ears     Assessment/Plan: 2 Days Post-Op Procedure(s) (LRB): ARTHROPLASTY, KNEE, TOTAL, USING IMAGELESS COMPUTER-ASSISTED NAVIGATION (Left) Principal Problem:   History of total knee arthroplasty, left  Estimated body mass index is 44.03 kg/m as calculated from the following:   Height as of this encounter: 4\' 11"  (1.499 m).   Weight as of this encounter: 98.9 kg. Advance diet Up with therapy  Patient will continue to work with physical therapy to pass postoperative PT protocols, ROM and strengthening  Discussed with the patient continuing to utilize Polar Care  Patient will use bone foam in 20-30 minute intervals  Patient will wear TED hose bilaterally to help prevent DVT and clot formation  Discussed the Aquacel bandage.  This bandage will stay in place 7 days postoperatively.  Can be replaced with honeycomb bandages that will be sent home with the patient  Discussed sending the patient home with tramadol  and oxycodone for as needed pain management.  Patient will also be sent home with Celebrex to help with swelling and inflammation.  Patient will take an 81 mg aspirin twice daily for DVT prophylaxis  JP drain removed without difficulty, intact  Weight-Bearing as tolerated to left leg  Patient will follow-up with [facility] orthopedics in 2 weeks for staple removal and  reevaluation  Wadie Guile, PA-C [facility] 06/20/2023, 7:26 AM

## 2023-06-20 NOTE — Progress Notes (Signed)
 Physical Therapy Treatment Patient Details Name: Whitney Banks MRN: [id number] DOB: 09-29-1950 Today's Date: 06/20/2023   History of Present Illness Pt is a 73 yo F diagnosed with degenerative arthrosis of the left knee and is s/p elective L TKA.  PMH includes HTN, HLD, R TKA, CAD, DM, and pulmonary HTN.    PT Comments  Pt was pleasant and motivated to participate during the session and put forth good effort throughout. Pt required extra time and effort with bed mobility tasks but no physical assist.  Pt was steady with transfers and was able to amb 80 feet this session with slow, step-to pattern but with no LOB and with no adverse symptoms other than min-mod L knee pain.  Pt declined to participate in stair training stating that she never has to access stairs at home or in the community.  Pt's SpO2 and HR were both WNL on room air throughout the session.  Pt stated feeling confident about returning home this date with no questions or concerns related to functional mobility.  Pt will benefit from continued PT services upon discharge to safely address deficits listed in patient problem list for decreased caregiver assistance and eventual return to PLOF.      If plan is discharge home, recommend the following: A little help with bathing/dressing/bathroom;Assistance with cooking/housework;Direct supervision/assist for medications management;Help with stairs or ramp for entrance;Assist for transportation;A little help with walking and/or transfers   Can travel by private vehicle        Equipment Recommendations  BSC/3in1    Recommendations for Other Services       Precautions / Restrictions Precautions Precautions: Knee Restrictions Weight Bearing Restrictions Per Provider Order: Yes LLE Weight Bearing Per Provider Order: Weight bearing as tolerated     Mobility  Bed Mobility Overal bed mobility: Needs Assistance       Supine to sit: Supervision     General bed mobility  comments: Extra time and effort along with use of the bed rail    Transfers Overall transfer level: Needs assistance Equipment used: Standard walker Transfers: Sit to/from Stand Sit to Stand: Supervision           General transfer comment: Good carryover of proper sequencing with min extra effort to come to standing but steady upon initial stand    Ambulation/Gait Ambulation/Gait assistance: Supervision Gait Distance (Feet): 80 Feet Assistive device: Rolling walker (2 wheels) Gait Pattern/deviations: Step-to pattern Gait velocity: decreased     General Gait Details: Pt self-selected step-to pattern with slow cadence but was steady throughout with no overt LOB   Stairs Stairs:  (Pt declined stair training and reported never having to climb stairs at home or in the community)           Wheelchair Mobility     Tilt Bed    Modified Rankin (Stroke Patients Only)       Balance Overall balance assessment: Needs assistance Sitting-balance support: No upper extremity supported, Feet unsupported Sitting balance-Leahy Scale: Normal     Standing balance support: During functional activity, Reliant on assistive device for balance, Bilateral upper extremity supported Standing balance-Leahy Scale: Good                              Communication Communication Communication: No apparent difficulties  Cognition Arousal: Alert Behavior During Therapy: WFL for tasks assessed/performed   PT - Cognitive impairments: No apparent impairments  Following commands: Intact      Cueing Cueing Techniques: Verbal cues, Visual cues, Tactile cues  Exercises Total Joint Exercises Quad Sets: AROM, Strengthening, Left, 10 reps Straight Leg Raises: AROM, Strengthening, Left, 5 reps Long Arc Quad: AROM, Strengthening, Left, 10 reps Knee Flexion: AROM, Strengthening, Left, 10 reps Goniometric ROM: L knee AROM: 0-74 deg Other  Exercises Other Exercises: Car transfer sequencing education provided Other Exercises: HEP education/review per handout    General Comments        Pertinent Vitals/Pain Pain Assessment Pain Assessment: 0-10 Pain Score: 2  Pain Location: L Knee Pain Descriptors / Indicators: Sore Pain Intervention(s): Repositioned, Premedicated before session, Monitored during session, Ice applied    Home Living                          Prior Function            PT Goals (current goals can now be found in the care plan section) Progress towards PT goals: Progressing toward goals    Frequency    BID      PT Plan      Co-evaluation              AM-PAC PT "6 Clicks" Mobility   Outcome Measure  Help needed turning from your back to your side while in a flat bed without using bedrails?: A Little Help needed moving from lying on your back to sitting on the side of a flat bed without using bedrails?: A Little Help needed moving to and from a bed to a chair (including a wheelchair)?: A Little Help needed standing up from a chair using your arms (e.g., wheelchair or bedside chair)?: A Little Help needed to walk in hospital room?: A Little Help needed climbing 3-5 steps with a railing? : A Lot 6 Click Score: 17    End of Session Equipment Utilized During Treatment: Gait belt Activity Tolerance: Patient tolerated treatment well Patient left: in chair;with call bell/phone within reach;with family/visitor present;with nursing/sitter in room (polar care donned to L knee; nursing assessing vitals and stated would connect SCD's when completed) Nurse Communication: Mobility status;Weight bearing status PT Visit Diagnosis: Other abnormalities of gait and mobility (R26.89);Muscle weakness (generalized) (M62.81);Pain Pain - Right/Left: Left Pain - part of body: Knee     Time: 0927-0958 PT Time Calculation (min) (ACUTE ONLY): 31 min  Charges:    $Gait Training: 8-22  mins $Therapeutic Exercise: 8-22 mins PT General Charges $$ ACUTE PT VISIT: 1 Visit                     D. Scott Tauno Falotico PT, DPT 06/20/23, 10:45 AM

## 2023-09-13 ENCOUNTER — Ambulatory Visit: Attending: Cardiovascular Disease | Admitting: Cardiovascular Disease

## 2023-09-13 ENCOUNTER — Encounter: Payer: Self-pay | Admitting: Cardiovascular Disease

## 2023-09-13 VITALS — BP 110/80 | HR 70 | Ht 59.0 in | Wt 211.1 lb

## 2023-09-13 DIAGNOSIS — I10 Essential (primary) hypertension: Secondary | ICD-10-CM

## 2023-09-13 DIAGNOSIS — I700 Atherosclerosis of aorta: Secondary | ICD-10-CM | POA: Diagnosis not present

## 2023-09-13 DIAGNOSIS — E785 Hyperlipidemia, unspecified: Secondary | ICD-10-CM

## 2023-09-13 NOTE — Progress Notes (Signed)
 Cardiology Office Note   Date:  09/13/2023   ID:  Whitney Banks, DOB 03/18/1950, MRN [id number]  PCP:  Buren Rock HERO, MD  Cardiologist:   Deatrice Cage, MD   Chief Complaint  Patient presents with   Follow-up    6 month f/u no complaints today. Meds reviewed verbally with pt.      History of Present Illness: Whitney Banks is a 73 y.o. female who presents for a follow-up visit.   Her mother was one of my patients and died of congestive heart failure at the age of 58.   She has past medical history of essential hypertension, asthma, obesity and diabetes mellitus. In 2014, she had right knee replacement and presented shortly after that with small bowel obstruction complicated by acute renal failure.  Troponin was mildly elevated.  Echocardiogram showed normal LV systolic function with possible septal hypokinesis.  There was mild tricuspid regurgitation and moderate to severe pulmonary hypertension.  She had a Lexiscan  Myoview  that showed no evidence of ischemia with normal ejection fraction.  A follow-up echocardiogram was done which showed normal LV systolic function and pulmonary pressure.  She was most recently seen by me in 2014.  She was seen last year for exertional dyspnea with intermittent episodes of chest pain.  She underwent a Lexiscan  Myoview  which showed no evidence of ischemia.  CT attenuation images showed mild aortic atherosclerosis and minimal coronary calcifications.  Echocardiogram showed normal LV systolic function with mild mitral regurgitation.  She underwent left knee replacement in April and recovered very well.  She has been doing well with no chest pain, shortness of breath or palpitations.    Past Medical History:  Diagnosis Date   Alpha thalassemia silent carrier    Anemia    Aortic atherosclerosis (HCC)    CAD (coronary artery disease)    Cerebral microvascular disease    Chronic gastritis    DDD (degenerative disc disease), lumbar     Degenerative arthritis of knee, bilateral    Diverticulosis    Essential hypertension    Gallstones    GERD (gastroesophageal reflux disease)    Hearing difficulty, right    Hemorrhagic stroke (HCC)    a.) brain MRI 10/09/2019: old hemorrhagic infarction in the RIGHT basal ganglia and corona radiata   HLD (hyperlipidemia)    Moderate persistent asthma without complication    Obesity, Class III, 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y)    Pneumonia    PONV (postoperative nausea and vomiting)    Postoperative ileus following knee surgery 2014   Pulmonary hypertension (HCC)    SBO (small bowel obstruction) (HCC)    Status post left cataract extraction    Type 2 diabetes mellitus (HCC)    Wears dentures    full upper, partial lower   Wears hearing aid in both ears     Past Surgical History:  Procedure Laterality Date   CATARACT EXTRACTION W/PHACO Left 12/04/2019   Procedure: CATARACT EXTRACTION PHACO AND INTRAOCULAR LENS PLACEMENT (IOC) LEFT DIABETIC VISION  3.40 00:33.8  ;  Surgeon: Ferol Rogue, MD;  Location: [facility];  Service: Ophthalmology;  Laterality: Left;  Diabetic - oral meds   CHOLECYSTECTOMY  1979   COLONOSCOPY WITH PROPOFOL  N/A 05/07/2015   Procedure: COLONOSCOPY WITH PROPOFOL ;  Surgeon: Deward CINDERELLA Piedmont, MD;  Location: [facility];  Service: Gastroenterology;  Laterality: N/A;   COLONOSCOPY WITH PROPOFOL  N/A 11/14/2018   Procedure: COLONOSCOPY WITH PROPOFOL ;  Surgeon: Gaylyn Gladis PENNER, MD;  Location:  [facility];  Service: Endoscopy;  Laterality: N/A;   ESOPHAGOGASTRODUODENOSCOPY (EGD) WITH PROPOFOL  N/A 05/07/2015   Procedure: ESOPHAGOGASTRODUODENOSCOPY (EGD) WITH PROPOFOL ;  Surgeon: Deward CINDERELLA Piedmont, MD;  Location: [facility];  Service: Gastroenterology;  Laterality: N/A;   ESOPHAGOGASTRODUODENOSCOPY (EGD) WITH PROPOFOL  N/A 11/14/2018   Procedure: ESOPHAGOGASTRODUODENOSCOPY (EGD) WITH PROPOFOL ;  Surgeon: Gaylyn Gladis PENNER, MD;  Location: [facility];  Service:  Endoscopy;  Laterality: N/A;   KNEE ARTHROPLASTY Left 06/18/2023   Procedure: ARTHROPLASTY, KNEE, TOTAL, USING IMAGELESS COMPUTER-ASSISTED NAVIGATION;  Surgeon: Mardee Lynwood SQUIBB, MD;  Location: [facility];  Service: Orthopedics;  Laterality: Left;   PARTIAL HYSTERECTOMY  1993   REVISION TOTAL KNEE ARTHROPLASTY Right 12/28/2017   TOTAL KNEE ARTHROPLASTY Right 09/02/2012     Current Outpatient Medications  Medication Sig Dispense Refill   albuterol  (VENTOLIN  HFA) 108 (90 Base) MCG/ACT inhaler Inhale 1-2 puffs into the lungs every 6 (six) hours as needed for wheezing or shortness of breath.     ascorbic acid (VITAMIN C) 500 MG tablet Take 500 mg by mouth in the morning.     carboxymethylcellulose (REFRESH PLUS) 0.5 % SOLN Place 1-2 drops into both eyes 3 (three) times daily as needed (dry/irritated eyes.).     cetirizine (ZYRTEC) 10 MG tablet Take 10 mg by mouth in the morning.     Cholecalciferol (VITAMIN D-3 PO) Take 1 tablet by mouth in the morning.     COD LIVER OIL PO Take 1 capsule by mouth in the morning.     Cyanocobalamin  (VITAMIN B-12 SL) Take 1 tablet by mouth in the morning.     Fluticasone-Umeclidin-Vilant (TRELEGY ELLIPTA ) 200-62.5-25 MCG/ACT AEPB Inhale 1 puff into the lungs daily. 60 each 11   losartan -hydrochlorothiazide  (HYZAAR) 100-25 MG tablet Take 1 tablet by mouth in the morning.     Multiple Vitamins-Minerals (ZINC PO) Take 1 tablet by mouth in the morning.     OVER THE COUNTER MEDICATION Take 1 capsule by mouth in the morning. SEA MOSS     OZEMPIC, 0.25 OR 0.5 MG/DOSE, 2 MG/3ML SOPN Inject 0.5 mg as directed once a week.     pantoprazole  (PROTONIX ) 40 MG tablet Take 40 mg by mouth in the morning and at bedtime.     VITAMIN A PO Take 1 capsule by mouth in the morning.     aspirin  81 MG chewable tablet Chew 1 tablet (81 mg total) by mouth 2 (two) times daily. (Patient not taking: Reported on 09/13/2023)     BYETTA  10 MCG PEN 10 MCG/0.04ML SOPN injection Inject 10 mcg into the  skin 2 (two) times daily with a meal. (Patient not taking: Reported on 09/13/2023)     celecoxib  (CELEBREX ) 200 MG capsule Take 1 capsule (200 mg total) by mouth 2 (two) times daily. (Patient not taking: Reported on 09/13/2023) 60 capsule 1   oxyCODONE  (OXY IR/ROXICODONE ) 5 MG immediate release tablet Take 1 tablet (5 mg total) by mouth every 4 (four) hours as needed for moderate pain (pain score 4-6) (pain score 4-6). (Patient not taking: Reported on 09/13/2023) 30 tablet 0   traMADol  (ULTRAM ) 50 MG tablet Take 1-2 tablets (50-100 mg total) by mouth every 4 (four) hours as needed for moderate pain (pain score 4-6). (Patient not taking: Reported on 09/13/2023) 30 tablet 0   No current facility-administered medications for this visit.    Allergies:   Lovastatin, Penicillins, and Lisinopril    Social History:  The patient  reports that she has never smoked. She has never  used smokeless tobacco. She reports that she does not drink alcohol  and does not use drugs.   Family History:  The patient's family history includes Breast cancer in her sister; Breast cancer (age of onset: 6) in her sister; Heart attack in her father; Heart failure in her mother.    ROS:  Please see the history of present illness.   Otherwise, review of systems are positive for none.   All other systems are reviewed and negative.    PHYSICAL EXAM: VS:  BP 110/80 (BP Location: Left Arm, Patient Position: Sitting, Cuff Size: Large)   Pulse 70   Ht 4' 11 (1.499 m)   Wt 211 lb 2 oz (95.8 kg)   BMI 42.64 kg/m  , BMI Body mass index is 42.64 kg/m. GEN: Well nourished, well developed, in no acute distress  HEENT: normal  Neck: no JVD, carotid bruits, or masses Cardiac: RRR; no murmurs, rubs, or gallops,no edema  Respiratory:  clear to auscultation bilaterally, normal work of breathing GI: soft, nontender, nondistended, + BS MS: no deformity or atrophy  Skin: warm and dry, no rash Neuro:  Strength and sensation are  intact Psych: euthymic mood, full affect   EKG:  EKG is ordered today. EKG showed normal sinus rhythm with nonspecific T wave changes.   Recent Labs: 06/07/2023: ALT 15; BUN 20; Creatinine, Ser 1.05; Hemoglobin 10.6; Platelets 346; Potassium 3.9; Sodium 134    Lipid Panel No results found for: CHOL, TRIG, HDL, CHOLHDL, VLDL, LDLCALC, LDLDIRECT    Wt Readings from Last 3 Encounters:  09/13/23 211 lb 2 oz (95.8 kg)  06/18/23 218 lb (98.9 kg)  06/07/23 218 lb (98.9 kg)          07/07/2022    9:27 AM  PAD Screen  Previous PAD dx? No  Previous surgical procedure? No  Pain with walking? No  Feet/toe relief with dangling? No  Painful, non-healing ulcers? No  Extremities discolored? No      ASSESSMENT AND PLAN:  1.  Mild coronary and aortic atherosclerosis: Negative stress test last year.  No symptoms of angina or claudication.  Continue treatment of risk factors. I discussed with her the importance of healthy lifestyle changes including regular exercise.  2.  Hyperlipidemia: Most recent LDL was 124.  Recommend healthy lifestyle changes and consider adding a statin if LDL remains above 100.  3.  Essential hypertension: Blood pressure is controlled.    Disposition:   No active cardiac issues.  Follow-up with us  as needed.  Signed,  Deatrice Cage, MD  09/13/2023 8:37 AM    [facility]

## 2023-09-13 NOTE — Patient Instructions (Signed)
 Medication Instructions:  Your physician recommends that you continue on your current medications as directed. Please refer to the Current Medication list given to you today.    *If you need a refill on your cardiac medications before your next appointment, please call your pharmacy*  Lab Work: No labs ordered today    Testing/Procedures: No test ordered today   Follow-Up: At [facility], you and your health needs are our priority.  As part of our continuing mission to provide you with exceptional heart care, our providers are all part of one team.  This team includes your primary Cardiologist (physician) and Advanced Practice Providers or APPs (Physician Assistants and Nurse Practitioners) who all work together to provide you with the care you need, when you need it.  Your next appointment:   As needed   Provider:   You may see Deatrice Cage, MD or one of the following Advanced Practice Providers on your designated Care Team:   Lonni Meager, NP Lesley Maffucci, PA-C Bernardino Bring, PA-C Cadence Atwater, PA-C Tylene Lunch, NP Barnie Hila, NP

## 2023-10-04 ENCOUNTER — Other Ambulatory Visit: Payer: Self-pay | Admitting: Family Medicine

## 2023-10-04 DIAGNOSIS — Z1231 Encounter for screening mammogram for malignant neoplasm of breast: Secondary | ICD-10-CM

## 2023-11-15 ENCOUNTER — Ambulatory Visit
Admission: RE | Admit: 2023-11-15 | Discharge: 2023-11-15 | Disposition: A | Discharge status: Home / Self Care | Source: Ambulatory Visit | Attending: Family Medicine | Admitting: Family Medicine

## 2023-11-15 DIAGNOSIS — Z1231 Encounter for screening mammogram for malignant neoplasm of breast: Secondary | ICD-10-CM | POA: Insufficient documentation

## 2023-11-27 ENCOUNTER — Telehealth: Payer: Self-pay | Admitting: Cardiovascular Disease

## 2023-11-27 NOTE — Telephone Encounter (Signed)
 Returned patient's call to let her know that Dr. Darron is only in the office Tuesday afternoon, 9/23 and all day on 9/24.  Pt needs DMV form filled out about her cardiovascular and diabetes diagnosis.  She states there is only a line for MD signature.  She is currently going to see her PCP regarding her diabetes and hoping PCP can touch base with Dr Renato recommendations and the PCP sign the form.  She will call back if she needs to bring form to Dr. Renato office

## 2023-11-27 NOTE — Telephone Encounter (Signed)
 Patient plans on stopping by the office to drop off paperwork from the [facility]. She is hopeful to have paperwork signed by Dr. Darron if at all possible. They are needing to verify patient's diagnosis and history--FYI.
